# Patient Record
Sex: Male | Born: 1940 | Race: Black or African American | Hispanic: No | Marital: Married | Smoking: Former smoker
Health system: Southern US, Community
[De-identification: ages and names within clinical notes are randomized; demographics above are authoritative.]

## PROBLEM LIST (undated history)

## (undated) DIAGNOSIS — I219 Acute myocardial infarction, unspecified: Secondary | ICD-10-CM

## (undated) DIAGNOSIS — E785 Hyperlipidemia, unspecified: Secondary | ICD-10-CM

## (undated) DIAGNOSIS — R0609 Other forms of dyspnea: Secondary | ICD-10-CM

## (undated) DIAGNOSIS — E119 Type 2 diabetes mellitus without complications: Secondary | ICD-10-CM

## (undated) DIAGNOSIS — Z9289 Personal history of other medical treatment: Secondary | ICD-10-CM

## (undated) DIAGNOSIS — R972 Elevated prostate specific antigen [PSA]: Secondary | ICD-10-CM

## (undated) DIAGNOSIS — I10 Essential (primary) hypertension: Secondary | ICD-10-CM

## (undated) DIAGNOSIS — I2510 Atherosclerotic heart disease of native coronary artery without angina pectoris: Secondary | ICD-10-CM

## (undated) DIAGNOSIS — G4730 Sleep apnea, unspecified: Secondary | ICD-10-CM

## (undated) DIAGNOSIS — R0600 Dyspnea, unspecified: Secondary | ICD-10-CM

## (undated) HISTORY — DX: Atherosclerotic heart disease of native coronary artery without angina pectoris: I25.10

## (undated) HISTORY — DX: Acute myocardial infarction, unspecified: I21.9

## (undated) HISTORY — DX: Sleep apnea, unspecified: G47.30

## (undated) HISTORY — DX: Dyspnea, unspecified: R06.00

## (undated) HISTORY — DX: Other forms of dyspnea: R06.09

## (undated) HISTORY — DX: Hyperlipidemia, unspecified: E78.5

## (undated) HISTORY — DX: Type 2 diabetes mellitus without complications: E11.9

## (undated) HISTORY — DX: Elevated prostate specific antigen (PSA): R97.20

## (undated) HISTORY — DX: Personal history of other medical treatment: Z92.89

## (undated) HISTORY — DX: Essential (primary) hypertension: I10

---

## 2005-03-12 DIAGNOSIS — I219 Acute myocardial infarction, unspecified: Secondary | ICD-10-CM

## 2005-03-12 HISTORY — DX: Acute myocardial infarction, unspecified: I21.9

## 2008-09-22 DIAGNOSIS — Z9289 Personal history of other medical treatment: Secondary | ICD-10-CM

## 2008-09-22 HISTORY — DX: Personal history of other medical treatment: Z92.89

## 2009-03-17 DIAGNOSIS — K469 Unspecified abdominal hernia without obstruction or gangrene: Secondary | ICD-10-CM | POA: Insufficient documentation

## 2010-08-29 DIAGNOSIS — J310 Chronic rhinitis: Secondary | ICD-10-CM | POA: Insufficient documentation

## 2010-08-29 DIAGNOSIS — F1011 Alcohol abuse, in remission: Secondary | ICD-10-CM | POA: Insufficient documentation

## 2010-08-29 DIAGNOSIS — H9190 Unspecified hearing loss, unspecified ear: Secondary | ICD-10-CM | POA: Insufficient documentation

## 2010-08-29 DIAGNOSIS — G4730 Sleep apnea, unspecified: Secondary | ICD-10-CM | POA: Insufficient documentation

## 2013-01-30 DIAGNOSIS — I2510 Atherosclerotic heart disease of native coronary artery without angina pectoris: Secondary | ICD-10-CM | POA: Insufficient documentation

## 2013-05-05 DIAGNOSIS — E1159 Type 2 diabetes mellitus with other circulatory complications: Secondary | ICD-10-CM | POA: Insufficient documentation

## 2013-05-05 DIAGNOSIS — E1169 Type 2 diabetes mellitus with other specified complication: Secondary | ICD-10-CM | POA: Insufficient documentation

## 2013-11-18 ENCOUNTER — Encounter: Payer: Self-pay | Admitting: Cardiovascular Disease

## 2013-11-18 ENCOUNTER — Ambulatory Visit (INDEPENDENT_AMBULATORY_CARE_PROVIDER_SITE_OTHER): Payer: Medicare HMO | Admitting: Cardiovascular Disease

## 2013-11-18 VITALS — BP 120/72 | HR 74 | Ht 71.5 in | Wt 243.3 lb

## 2013-11-18 DIAGNOSIS — R0989 Other specified symptoms and signs involving the circulatory and respiratory systems: Secondary | ICD-10-CM | POA: Diagnosis not present

## 2013-11-18 DIAGNOSIS — E785 Hyperlipidemia, unspecified: Secondary | ICD-10-CM

## 2013-11-18 DIAGNOSIS — E119 Type 2 diabetes mellitus without complications: Secondary | ICD-10-CM | POA: Insufficient documentation

## 2013-11-18 DIAGNOSIS — I10 Essential (primary) hypertension: Secondary | ICD-10-CM | POA: Diagnosis not present

## 2013-11-18 DIAGNOSIS — R0609 Other forms of dyspnea: Secondary | ICD-10-CM | POA: Insufficient documentation

## 2013-11-18 DIAGNOSIS — G4733 Obstructive sleep apnea (adult) (pediatric): Secondary | ICD-10-CM | POA: Insufficient documentation

## 2013-11-18 NOTE — Assessment & Plan Note (Signed)
Controlled on current medications 

## 2013-11-18 NOTE — Assessment & Plan Note (Signed)
On statin therapy followed by his PCP 

## 2013-11-18 NOTE — Assessment & Plan Note (Signed)
The patient has new-onset dyspnea on exertion over the last to 6 months of some chest heaviness. He does have a remote history of a myocardial infarction by his account with thrombolytic therapy in [place] in 2007. He does have a factors including remote tobacco abuse, diabetes, hypertension and hyperlipidemia. I'm going to obtain a 2-D echocardiogram for LV function as well as an exercise Myoview stress test to rule out an ischemic etiology.

## 2013-11-18 NOTE — Progress Notes (Signed)
11/18/2013 Dominic Goodwin   06-22-1940  [id number]  Primary Physician Dominic Dials, MD Primary Cardiologist: Dominic Gess MD Dominic Goodwin   HPI:  Dominic Goodwin  is a 73 year old moderately overweight married African American father of one, grandfather and 4 grandchildren report provided by Dominic Goodwin for cardiovascular evaluation because of new-onset dyspnea. He is a retired Engineer, production. He moved to [place] 7 years ago to be closer to his family although since that time 2 of his sisters have passed away. His cardiac risk factor profile is notable for remote tobacco abuse having smoked 40-pack-years and quit 15 years ago. He also stopped drinking back in the 80s. 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 He apparently did have a myocardial infarction in 2007 [facility] in [place]. Over the last 6 months she's noticed increasing dyspnea on exertion with some substernal chest heaviness.   Current Outpatient Prescriptions  Medication Sig Dispense Refill  . aspirin EC 81 MG tablet Take 81 mg by mouth daily.      [name] atorvastatin (LIPITOR) 80 MG tablet Take 1 tablet by mouth daily.      . Cholecalciferol (VITAMIN D-1000 MAX ST) 1000 UNITS tablet Take 1 tablet by mouth daily.      [name] FIBER PO Take by mouth daily.      . finasteride (PROSCAR) 5 MG tablet Take 5 mg by mouth daily.      [name] glipiZIDE (GLUCOTROL) 5 MG tablet Take 5 mg by mouth 2 (two) times daily.      . isosorbide mononitrate (IMDUR) 30 MG 24 hr tablet Take 30 mg by mouth daily.      [name] losartan (COZAAR) 100 MG tablet Take 100 mg by mouth daily.      . metFORMIN (GLUCOPHAGE) 500 MG tablet Take 500 mg by mouth 2 (two) times daily.      . metoprolol (LOPRESSOR) 50 MG tablet Take 50 mg by mouth 2 (two) times daily.      . tamsulosin (FLOMAX) 0.4 MG CAPS capsule Take 0.4 mg by mouth daily.       No current facility-administered medications for this visit.    No Known Allergies  History    Social History  . Marital Status: Married    Spouse Name: N/A    Number of Children: N/A  . Years of Education: N/A   Occupational History  . Not on file.   Social History Main Topics  . Smoking status: Former Smoker    Quit date: 11/18/2000  . Smokeless tobacco: Not on file  . Alcohol Use: No  . Drug Use: No  . Sexual Activity: Not on file   Goodwin Topics Concern  . Not on file   Social History Narrative  . No narrative on file     Review of Systems: General: negative for chills, fever, night sweats or weight changes.  Cardiovascular: negative for chest pain, dyspnea on exertion, edema, orthopnea, palpitations, paroxysmal nocturnal dyspnea or shortness of breath Dermatological: negative for rash Respiratory: negative for cough or wheezing Urologic: negative for hematuria Abdominal: negative for nausea, vomiting, diarrhea, bright red blood per rectum, melena, or hematemesis Neurologic: negative for visual changes, syncope, or dizziness All Goodwin systems reviewed and are otherwise negative except as noted above.    Blood pressure 120/72, pulse 74, height 5' 11.5" (1.816 m), weight 243 lb 4.8 oz (110.36 kg).  General appearance: alert and no distress Neck: no adenopathy, no carotid bruit, no  JVD, supple, symmetrical, trachea midline and thyroid not enlarged, symmetric, no tenderness/mass/nodules Lungs: clear to auscultation bilaterally Heart: regular rate and rhythm, S1, S2 normal, no murmur, click, rub or gallop Extremities: extremities normal, atraumatic, no cyanosis or edema and 2+ pedal pulses  EKG normal sinus rhythm at 74 without ST or T wave changes  ASSESSMENT AND PLAN:   Dyspnea on exertion The patient has new-onset dyspnea on exertion over the last to 6 months of some chest heaviness. He does have a remote history of a myocardial infarction by his account with thrombolytic therapy in [place] in 2007. He does have a factors including remote tobacco  abuse, diabetes, hypertension and hyperlipidemia. I'm going to obtain a 2-D echocardiogram for LV function as well as an exercise Myoview stress test to rule out an ischemic etiology.  Essential hypertension Controlled on current medications  Hyperlipidemia On statin therapy followed by his PCP      Dominic Gess MD [facility], [facility] 11/18/2013 2:13 PM

## 2013-11-18 NOTE — Patient Instructions (Signed)
Your physician has requested that you have an echocardiogram. Echocardiography is a painless test that uses sound waves to create images of your heart. It provides your doctor with information about the size and shape of your heart and how well your heart's chambers and valves are working. This procedure takes approximately one hour. There are no restrictions for this procedure.  Your physician has requested that you have an exercise stress myoview. For further information please visit [email or web address] Please follow instruction sheet, as given.  Your physician recommends that you schedule a follow-up appointment with Dr. Allyson Sabal after your tests.

## 2013-12-03 ENCOUNTER — Ambulatory Visit (HOSPITAL_COMMUNITY)
Admission: RE | Admit: 2013-12-03 | Discharge: 2013-12-03 | Disposition: A | Discharge status: Home / Self Care | Payer: Medicare HMO | Source: Ambulatory Visit | Attending: Cardiovascular Disease | Admitting: Cardiovascular Disease

## 2013-12-03 DIAGNOSIS — E785 Hyperlipidemia, unspecified: Secondary | ICD-10-CM | POA: Insufficient documentation

## 2013-12-03 DIAGNOSIS — R0989 Other specified symptoms and signs involving the circulatory and respiratory systems: Secondary | ICD-10-CM

## 2013-12-03 DIAGNOSIS — I10 Essential (primary) hypertension: Secondary | ICD-10-CM | POA: Diagnosis not present

## 2013-12-03 DIAGNOSIS — Z87891 Personal history of nicotine dependence: Secondary | ICD-10-CM | POA: Insufficient documentation

## 2013-12-03 DIAGNOSIS — I369 Nonrheumatic tricuspid valve disorder, unspecified: Secondary | ICD-10-CM

## 2013-12-03 DIAGNOSIS — I252 Old myocardial infarction: Secondary | ICD-10-CM | POA: Diagnosis not present

## 2013-12-03 DIAGNOSIS — R0602 Shortness of breath: Secondary | ICD-10-CM | POA: Diagnosis present

## 2013-12-03 DIAGNOSIS — R0609 Other forms of dyspnea: Secondary | ICD-10-CM

## 2013-12-03 NOTE — Progress Notes (Signed)
2D Echocardiogram Complete.  12/03/2013   Dawid Dupriest, RDCS  

## 2013-12-04 ENCOUNTER — Ambulatory Visit (HOSPITAL_COMMUNITY)
Admission: RE | Admit: 2013-12-04 | Discharge: 2013-12-04 | Disposition: A | Discharge status: Home / Self Care | Payer: Medicare HMO | Source: Ambulatory Visit | Attending: Cardiovascular Disease | Admitting: Cardiovascular Disease

## 2013-12-04 ENCOUNTER — Inpatient Hospital Stay (HOSPITAL_COMMUNITY): Admission: RE | Admit: 2013-12-04 | Payer: Commercial Managed Care - HMO | Source: Ambulatory Visit

## 2013-12-04 DIAGNOSIS — R0789 Other chest pain: Secondary | ICD-10-CM | POA: Diagnosis not present

## 2013-12-04 DIAGNOSIS — E119 Type 2 diabetes mellitus without complications: Secondary | ICD-10-CM | POA: Insufficient documentation

## 2013-12-04 DIAGNOSIS — R0609 Other forms of dyspnea: Secondary | ICD-10-CM | POA: Insufficient documentation

## 2013-12-04 DIAGNOSIS — E669 Obesity, unspecified: Secondary | ICD-10-CM | POA: Diagnosis not present

## 2013-12-04 DIAGNOSIS — I10 Essential (primary) hypertension: Secondary | ICD-10-CM | POA: Insufficient documentation

## 2013-12-04 DIAGNOSIS — Z87891 Personal history of nicotine dependence: Secondary | ICD-10-CM | POA: Diagnosis not present

## 2013-12-04 DIAGNOSIS — R0989 Other specified symptoms and signs involving the circulatory and respiratory systems: Secondary | ICD-10-CM | POA: Diagnosis present

## 2013-12-04 DIAGNOSIS — E785 Hyperlipidemia, unspecified: Secondary | ICD-10-CM | POA: Diagnosis not present

## 2013-12-04 MED ORDER — TECHNETIUM TC 99M SESTAMIBI GENERIC - CARDIOLITE
31.6000 | Freq: Once | INTRAVENOUS | Status: AC | PRN
  Administered 2013-12-04: 32 via INTRAVENOUS

## 2013-12-04 MED ORDER — TECHNETIUM TC 99M SESTAMIBI GENERIC - CARDIOLITE
10.9000 | Freq: Once | INTRAVENOUS | Status: AC | PRN
  Administered 2013-12-04: 10.9 via INTRAVENOUS

## 2013-12-04 NOTE — Procedures (Addendum)
[facility] [address] [place] [address] [place] [address] [phone]  Cardiology Nuclear Med Study  Dominic Goodwin is a 73 y.o. male     MRN : [id number]     DOB: [birth year]  Procedure Date: 12/04/2013  Nuclear Med Background Indication for Stress Test:  Evaluation for Ischemia History:  CAD, MI 2007 Cardiac Risk Factors: History of Smoking, Hypertension, Lipids, NIDDM and Overweight  Symptoms:  DOE, SOB and chest heaviness   Nuclear Pre-Procedure Caffeine/Decaff Intake:  8:30pm NPO After: 6:30am   IV Site: R Antecubital  IV 0.9% NS with Angio Cath:  22g  Chest Size (in):  48 IV Started by: Koren Shiver, CNMT  Height: 6' (1.829 m)  Cup Size: n/a  BMI:  Body mass index is 32.95 kg/(m^2). Weight:  243 lb (110.224 kg)   Tech Comments:  Held metoprol and Isosorbide 24hrs prior to test    Nuclear Med Study 1 or 2 day study: 1 day  Stress Test Type:  Stress  Order Authorizing Provider:  Nanetta Batty, MD   Resting Radionuclide: Technetium 61m Sestamibi  Resting Radionuclide Dose: 10.9 mCi   Stress Radionuclide:  Technetium 15m Sestamibi  Stress Radionuclide Dose: 31.6 mCi           Stress Protocol Rest HR: 71 Stress HR: 151  Rest BP: 152/94 Stress BP: 190/84  Exercise Time (min): 7:15 METS: 7.50   Predicted Max HR: 149 bpm % Max HR: 101.34 bpm Rate Pressure Product: 16606  Dose of Adenosine (mg):  n/a Dose of Lexiscan: n/a mg  Dose of Atropine (mg): n/a Dose of Dobutamine: n/a mcg/kg/min (at max HR)  Stress Test Technologist: Ernestene Mention, CCT Nuclear Technologist: Gonzella Lex, CNMT   Rest Procedure:  Myocardial perfusion imaging was performed at rest 45 minutes following the intravenous administration of Technetium 27m Sestamibi. Stress Procedure:  The patient performed treadmill exercise using a Bruce  Protocol for 7 minutes 15 seconds. The patient stopped due to shortness of breath and fatigue. Patient denied any chest  pain.  There were significant ST-T wave changes.  Technetium 32m Sestamibi was injected IV at peak exercise and myocardial perfusion imaging was performed after a brief delay.  Transient Ischemic Dilatation (Normal <1.22):  0.88  QGS EDV:  91 ml QGS ESV:  34 ml LV Ejection Fraction: 62%     Rest ECG: NSR - Normal EKG  Stress ECG: Significant ST abnormalities consistent with ischemia. 2 mm mildly upsloping ST depression in the inferior and lateral leads  QPS Raw Data Images:  Mild diaphragmatic attenuation.  Normal left ventricular size. Stress Images:  There is mildly decreased uptake in the basal inferior wall. Rest Images:  Comparison with the stress images reveals no significant change. Subtraction (SDS):  No evidence of ischemia. LV Wall Motion:  NL LV Function; NL Wall Motion  Impression Exercise Capacity:  Good exercise capacity. BP Response:  Normal blood pressure response. Clinical Symptoms:  No significant symptoms noted. ECG Impression:  Significant ST abnormalities consistent with ischemia. Comparison with Prior Nuclear Study: No previous nuclear study performed   Overall Impression:  Low risk stress nuclear study with mild diaphragmatic attenuation artifact. Cannot exclude a small inferobasal non-transmural scar, but ischemia is not seen.Thurmon Fair, MD  12/04/2013 12:30 PM

## 2013-12-07 ENCOUNTER — Encounter: Payer: Self-pay | Admitting: *Deleted

## 2013-12-25 ENCOUNTER — Encounter: Payer: Self-pay | Admitting: *Deleted

## 2013-12-29 ENCOUNTER — Ambulatory Visit (INDEPENDENT_AMBULATORY_CARE_PROVIDER_SITE_OTHER): Payer: Medicare HMO | Admitting: Podiatry

## 2013-12-29 ENCOUNTER — Encounter: Payer: Self-pay | Admitting: Podiatry

## 2013-12-29 VITALS — BP 167/93 | HR 61 | Resp 17

## 2013-12-29 DIAGNOSIS — E119 Type 2 diabetes mellitus without complications: Secondary | ICD-10-CM

## 2013-12-29 DIAGNOSIS — M2041 Other hammer toe(s) (acquired), right foot: Secondary | ICD-10-CM

## 2013-12-29 DIAGNOSIS — M79609 Pain in unspecified limb: Secondary | ICD-10-CM

## 2013-12-29 DIAGNOSIS — B351 Tinea unguium: Secondary | ICD-10-CM

## 2013-12-29 NOTE — Progress Notes (Signed)
   Subjective:    Patient ID: Dominic Goodwin, male    DOB: 02/16/1941, 73 y.o.   MRN: [id number]  HPI Pt presents for diabetic foot care. C/o toenail pain from time to time when nails get long, currently his nails do not hurt, states that his wife does his nail care. Denies pain in feet, numbness or tingling. Current A1C 6.6 on 11/17/13   Review of Systems  Endocrine: Positive for polyuria.  All other systems reviewed and are negative.      Objective:   Physical Exam: I have reviewed his past medical history medications allergies surgeries social history and review of systems. Pulses are strongly palpable bilateral. Neurologic sensorium is intact per Semmes-Weinstein monofilament. Deep tendon reflexes are intact bilateral muscle strength is 5 over 5 dorsiflexors plantar flexors inverters everters all intrinsic musculature is intact. Orthopedic evaluation demonstrates all joints distal to the ankle a full range of motion without crepitation with exception of the first metatarsophalangeal joint of the right foot possibly associated with trauma from childhood. Radiographs were not taken today. Hammertoe deformities are present bilateral. His nails are thick yellow dystrophic onychomycotic and painful palpation. Sharp incurvated nail margin along the tibial and fibular border of the hallux bilateral. Dry xerotic skin to the legs and dorsal aspect foot.        Assessment & Plan:  Assessment: Diabetes with pain in limb secondary to onychomycosis a sharp incurvated nail margins bilateral.  Plan: Debridement of nails 1 through 5 bilateral covered service secondary to pain.

## 2014-01-01 ENCOUNTER — Ambulatory Visit: Payer: Commercial Managed Care - HMO | Admitting: Cardiovascular Disease

## 2014-02-09 ENCOUNTER — Ambulatory Visit (INDEPENDENT_AMBULATORY_CARE_PROVIDER_SITE_OTHER): Payer: Medicare HMO | Admitting: Cardiovascular Disease

## 2014-02-09 ENCOUNTER — Encounter: Payer: Self-pay | Admitting: Cardiovascular Disease

## 2014-02-09 VITALS — BP 150/82 | HR 80 | Ht 71.0 in | Wt 246.3 lb

## 2014-02-09 DIAGNOSIS — R0609 Other forms of dyspnea: Secondary | ICD-10-CM

## 2014-02-09 DIAGNOSIS — E785 Hyperlipidemia, unspecified: Secondary | ICD-10-CM

## 2014-02-09 DIAGNOSIS — I10 Essential (primary) hypertension: Secondary | ICD-10-CM

## 2014-02-09 NOTE — Assessment & Plan Note (Signed)
History of dyspnea on exertion with recent negative Myoview stress test and 2-D echocardiogram.

## 2014-02-09 NOTE — Assessment & Plan Note (Signed)
History of hypertension with blood pressure measured today at 150/82. He is on was started 100 mg a day, and Lopressor. Maintain current medications at current dose

## 2014-02-09 NOTE — Assessment & Plan Note (Signed)
History of hyperlipidemia on atorvastatin 80 mg a day followed by his PCP 

## 2014-02-09 NOTE — Patient Instructions (Signed)
Your physician wants you to follow-up in 1 year with Dr. Berry. You will receive a reminder letter in the mail 2 months in advance. If you do not receive a letter, please call our office to schedule the follow-up appointment.  

## 2014-02-09 NOTE — Progress Notes (Signed)
02/09/2014 Dominic DenmarkFred Hefty   03/21/1940  [id number]  Primary Physician Aura DialsBOUSKA,DAVID E, MD Primary Cardiologist: Runell GessJonathan J. Marvella Jenning MD Roseanne RenoFACP,FACC,FAHA, FSCAI   HPI:  Dominic Goodwin is Dominic Goodwin, Dominic Goodwin referred by Dr. Everlene OtherBouska for cardiovascular evaluation because of new-onset dyspnea. He is Dominic retired Engineer, [employer]. [place] moved [place] ago to be closer to his family although since that time 2 of his sisters have passed away. His cardiac risk factor profile is notable for remote tobacco abuse having smoked 40-pack-years and quit 15 years ago. He also stopped drinking back in the 80s. He has Dominic history of hypertension, died of hyperlipidemia. He apparently did have Dominic myocardial infarction in 2007 [place]. Over the last 6 months she's noticed increasing dyspnea on exertion with some substernal chest heaviness. Since I saw him last in September I performed Dominic 2-D echocardiogram which was normal as was Dominic Myoview stress test.   Current Outpatient Prescriptions  Medication Sig Dispense Refill  . aspirin EC 81 MG tablet Take 81 mg by mouth daily.    [name]. atorvastatin (LIPITOR) 80 MG tablet Take 1 tablet by mouth daily.    . Cholecalciferol (VITAMIN D-1000 MAX ST) 1000 UNITS tablet Take 1 tablet by mouth daily.    [name]. FIBER PO Take by mouth daily.    . finasteride (PROSCAR) 5 MG tablet Take 5 mg by mouth daily.    [name]. glipiZIDE (GLUCOTROL) 5 MG tablet Take 5 mg by mouth 2 (two) times daily.    . isosorbide mononitrate (IMDUR) 30 MG 24 hr tablet Take 30 mg by mouth daily.    [name]. losartan (COZAAR) 100 MG tablet Take 100 mg by mouth daily.    . metFORMIN (GLUCOPHAGE) 500 MG tablet Take 500 mg by mouth 2 (two) times daily.    . metoprolol (LOPRESSOR) 50 MG tablet Take 50 mg by mouth 2 (two) times daily.     No current facility-administered medications for this visit.    No Known  Allergies  History   Social History  . Marital Status: Married    Spouse Name: N/Dominic    Number of Children: N/Dominic  . Years of Education: N/Dominic   Occupational History  . Not on file.   Social History Main Topics  . Smoking status: Former Smoker    Quit date: 11/18/2000  . Smokeless tobacco: Not on file  . Alcohol Use: No  . Drug Use: No  . Sexual Activity: Not on file   Other Topics Concern  . Not on file   Social History Narrative     Review of Systems: General: negative for chills, fever, night sweats or weight changes.  Cardiovascular: negative for chest pain, dyspnea on exertion, edema, orthopnea, palpitations, paroxysmal nocturnal dyspnea or shortness of breath Dermatological: negative for rash Respiratory: negative for cough or wheezing Urologic: negative for hematuria Abdominal: negative for nausea, vomiting, diarrhea, bright red blood per rectum, melena, or hematemesis Neurologic: negative for visual changes, syncope, or dizziness All other systems reviewed and are otherwise negative except as noted above.    Blood pressure 150/82, pulse 80, height 5\' 11"  (1.803 m), weight 246 lb 4.8 oz (111.721 kg).  General appearance: alert and no distress Neck: no adenopathy, no carotid bruit, no JVD, supple, symmetrical, trachea midline and thyroid not enlarged, symmetric, no tenderness/mass/nodules Lungs: clear to auscultation bilaterally Heart: regular rate and rhythm, S1, S2  normal, no murmur, click, rub or gallop Extremities: extremities normal, atraumatic, no cyanosis or edema  EKG not performed today  ASSESSMENT AND PLAN:   Essential hypertension History of hypertension with blood pressure measured today at 150/82. He is on was started 100 mg Dominic day, and Lopressor. Maintain current medications at current dose  Hyperlipidemia History of hyperlipidemia on atorvastatin 80 mg Dominic day followed by his PCP  Dyspnea on exertion History of dyspnea on exertion with recent  negative Myoview stress test and 2-D echocardiogram.      Runell GessJonathan J. Jassmin Kemmerer MD [facility] HospitalFACP,FACC,FAHA, [facility]SC[facility] 02/09/2014 4:44 PM

## 2014-04-01 ENCOUNTER — Ambulatory Visit: Payer: Medicare HMO | Admitting: Podiatry

## 2014-11-23 DIAGNOSIS — E1159 Type 2 diabetes mellitus with other circulatory complications: Secondary | ICD-10-CM | POA: Insufficient documentation

## 2015-04-26 DIAGNOSIS — R351 Nocturia: Secondary | ICD-10-CM | POA: Insufficient documentation

## 2015-06-27 DIAGNOSIS — H269 Unspecified cataract: Secondary | ICD-10-CM | POA: Insufficient documentation

## 2015-07-25 DIAGNOSIS — G252 Other specified forms of tremor: Secondary | ICD-10-CM | POA: Insufficient documentation

## 2015-07-26 ENCOUNTER — Telehealth: Payer: Self-pay | Admitting: Cardiovascular Disease

## 2015-07-26 NOTE — Telephone Encounter (Signed)
[facility] appointment on 08/10/15 with Dr Allyson SabalBerry.  [facility] (medical records) for Dr Hazle CocaBerry's schedule on 08/10/15. lp

## 2015-08-10 ENCOUNTER — Encounter: Payer: Self-pay | Admitting: Cardiovascular Disease

## 2015-08-10 ENCOUNTER — Ambulatory Visit (INDEPENDENT_AMBULATORY_CARE_PROVIDER_SITE_OTHER): Payer: Medicare HMO | Admitting: Cardiovascular Disease

## 2015-08-10 VITALS — BP 122/73 | HR 63 | Ht 71.5 in | Wt 235.0 lb

## 2015-08-10 DIAGNOSIS — R0609 Other forms of dyspnea: Secondary | ICD-10-CM | POA: Diagnosis not present

## 2015-08-10 DIAGNOSIS — I10 Essential (primary) hypertension: Secondary | ICD-10-CM | POA: Diagnosis not present

## 2015-08-10 NOTE — Patient Instructions (Signed)

## 2015-08-10 NOTE — Assessment & Plan Note (Signed)
History of hyperlipidemia on statin therapy followed by his PCP 

## 2015-08-10 NOTE — Assessment & Plan Note (Signed)
History of dyspnea on exertion and atypical chest pain with a negative Myoview and 2-D echo performed in December 2015. His symptoms have not changed in frequency or severity.

## 2015-08-10 NOTE — Assessment & Plan Note (Signed)
History of hypertension blood pressure measured at 122/73. He is on metoprolol and losartan continue current meds at current dosing

## 2015-08-10 NOTE — Progress Notes (Signed)
08/10/2015 Dominic Goodwin   Dec 22, 1940  [id number]  Primary Physician Aura Dials, MD Primary Cardiologist: Runell Gess MD Roseanne Reno   HPI:  Mr. Faulkenberry is a 75 year old moderately overweight married African American father of one, grandfather and 4 grandchildren referred by Dr. Everlene Other for cardiovascular evaluation because of new-onset dyspnea. i last saw him in the office 02/09/14. He is a retired Engineer, production. He moved to [place] 7 years ago to be closer to his family although since that time 2 of his sisters have passed away. His cardiac risk factor profile is notable for remote tobacco abuse having smoked 40-pack-years and quit 15 years ago. He also stopped drinking back in the 80s. 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 He apparently did have a myocardial infarction in 2007 treated with thrombolytics in [place]. He had a negative 2-D echo and Myoview stress test September 2015. Since I saw him in December 2015, he's had occasional substernal chest pain  Current Outpatient Prescriptions  Medication Sig Dispense Refill  . aspirin EC 81 MG tablet Take 81 mg by mouth daily.    [name] atorvastatin (LIPITOR) 80 MG tablet Take 1 tablet by mouth daily.    [name] buPROPion (WELLBUTRIN XL) 300 MG 24 hr tablet Take 1 tablet by mouth daily.    . busPIRone (BUSPAR) 15 MG tablet Take 1 tablet by mouth 2 (two) times daily.    . cetirizine (ZYRTEC) 10 MG tablet Take 10 mg by mouth daily.    . Cholecalciferol (VITAMIN D-1000 MAX ST) 1000 UNITS tablet Take 1 tablet by mouth daily.    . cycloSPORINE (RESTASIS) 0.05 % ophthalmic emulsion Place 1 drop into both eyes daily.    [name] FIBER PO Take by mouth daily.    . finasteride (PROSCAR) 5 MG tablet Take 5 mg by mouth daily.    . fluticasone (FLONASE) 50 MCG/ACT nasal spray Place 2 sprays into both nostrils as needed.    [name] glipiZIDE (GLUCOTROL) 5 MG tablet Take 5 mg by mouth 2 (two) times daily.    [name] ipratropium  (ATROVENT) 0.03 % nasal spray Place 2 sprays into both nostrils daily.    . isosorbide mononitrate (IMDUR) 30 MG 24 hr tablet Take 30 mg by mouth daily.    [name] losartan (COZAAR) 100 MG tablet Take 100 mg by mouth daily.    . metFORMIN (GLUCOPHAGE) 500 MG tablet Take 500 mg by mouth 2 (two) times daily.    . metoprolol (LOPRESSOR) 50 MG tablet Take 50 mg by mouth 2 (two) times daily.    . primidone (MYSOLINE) 50 MG tablet Take 1 tablet by mouth 2 (two) times daily.    . tamsulosin (FLOMAX) 0.4 MG CAPS capsule Take 1 capsule by mouth daily.     No current facility-administered medications for this visit.    No Known Allergies  Social History   Social History  . Marital Status: Married    Spouse Name: N/A  . Number of Children: N/A  . Years of Education: N/A   Occupational History  . Not on file.   Social History Main Topics  . Smoking status: Former Smoker    Quit date: 11/18/2000  . Smokeless tobacco: Not on file  . Alcohol Use: No  . Drug Use: No  . Sexual Activity: Not on file   Other Topics Concern  . Not on file   Social History Narrative     Review of Systems: General: negative for  chills, fever, night sweats or weight changes.  Cardiovascular: negative for chest pain, dyspnea on exertion, edema, orthopnea, palpitations, paroxysmal nocturnal dyspnea or shortness of breath Dermatological: negative for rash Respiratory: negative for cough or wheezing Urologic: negative for hematuria Abdominal: negative for nausea, vomiting, diarrhea, bright red blood per rectum, melena, or hematemesis Neurologic: negative for visual changes, syncope, or dizziness All other systems reviewed and are otherwise negative except as noted above.    Blood pressure 122/73, pulse 63, height 5' 11.5" (1.816 m), weight 235 lb (106.595 kg).  General appearance: alert and no distress Neck: no adenopathy, no carotid bruit, no JVD, supple, symmetrical, trachea midline and thyroid not enlarged,  symmetric, no tenderness/mass/nodules Lungs: clear to auscultation bilaterally Heart: regular rate and rhythm, S1, S2 normal, no murmur, click, rub or gallop Extremities: extremities normal, atraumatic, no cyanosis or edema  EKG normal sinus rhythm at 63 without ST or T-wave changes. I personally reviewed this EKG  ASSESSMENT AND PLAN:   Essential hypertension History of hypertension blood pressure measured at 122/73. He is on metoprolol and losartan continue current meds at current dosing  Hyperlipidemia History of hyperlipidemia on statin therapy followed by his PCP  Dyspnea on exertion History of dyspnea on exertion and atypical chest pain with a negative Myoview and 2-D echo performed in December 2015. His symptoms have not changed in frequency or severity.      Runell GessJonathan J. Hedy Garro MD FACP,FACC,FAHA, [facility] 08/10/2015 10:59 AM

## 2015-10-20 DIAGNOSIS — H04129 Dry eye syndrome of unspecified lacrimal gland: Secondary | ICD-10-CM | POA: Insufficient documentation

## 2016-02-01 DIAGNOSIS — Z0000 Encounter for general adult medical examination without abnormal findings: Secondary | ICD-10-CM | POA: Insufficient documentation

## 2017-01-23 DIAGNOSIS — Z1211 Encounter for screening for malignant neoplasm of colon: Secondary | ICD-10-CM | POA: Insufficient documentation

## 2017-01-23 DIAGNOSIS — R413 Other amnesia: Secondary | ICD-10-CM | POA: Insufficient documentation

## 2017-07-24 ENCOUNTER — Telehealth: Payer: Self-pay | Admitting: Cardiovascular Disease

## 2017-07-24 NOTE — Telephone Encounter (Signed)
Received records from [facility] on 07/24/17, Appt 08/13/17 @ 3:00PM. NV

## 2017-08-13 ENCOUNTER — Ambulatory Visit: Payer: Medicare HMO | Admitting: Cardiovascular Disease

## 2017-08-13 ENCOUNTER — Encounter: Payer: Self-pay | Admitting: Cardiovascular Disease

## 2017-08-13 DIAGNOSIS — I10 Essential (primary) hypertension: Secondary | ICD-10-CM | POA: Diagnosis not present

## 2017-08-13 NOTE — Assessment & Plan Note (Signed)
History of hyperlipidemia on statin therapy. 

## 2017-08-13 NOTE — Assessment & Plan Note (Signed)
History of essential hypertension with blood pressure measured at 121/71.  He is on losartan.  Continue current meds at current dosing.

## 2017-08-13 NOTE — Progress Notes (Signed)
08/13/2017 Dominic Goodwin   February 13, 1941  [id number]  Primary Physician Dominic Harries, MD Primary Cardiologist: Dominic Gess MD Dominic Goodwin, [place]  HPI:  Dominic Goodwin is a 77 y.o.  moderately overweight married African American father of one, grandfather and 4 grandchildren referred by Dr. Everlene Other for cardiovascular evaluation because of new-onset dyspnea. i last saw him in the office 08/10/2015.Dominic Goodwin He is a retired Engineer, production. He moved to [place] 7 years ago to be closer to his family although since that time 2 of his sisters have passed away. His cardiac risk factor profile is notable for remote tobacco abuse having smoked 40-pack-years and quit 15 years ago. He also stopped drinking back in the 80s. 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 He apparently did have a myocardial infarction in 2007 treated with thrombolytics in [place]. He had a negative 2-D echo and Myoview stress test September 2015. Since I saw him in December 2015, he's had occasional substernal chest pain  He apparently had an episode of chest pain which is fairly self-limited well mowing his lawn recently.  He became diaphoretic however the symptoms passed fairly quickly.  He has not had any symptoms since.   Current Meds  Medication Sig  . aspirin EC 81 MG tablet Take 81 mg by mouth daily.  Dominic Goodwin atorvastatin (LIPITOR) 80 MG tablet Take 1 tablet by mouth daily.  . cetirizine (ZYRTEC) 10 MG tablet Take 10 mg by mouth daily.  . Cholecalciferol (VITAMIN D-1000 MAX ST) 1000 UNITS tablet Take 1 tablet by mouth daily.  . finasteride (PROSCAR) 5 MG tablet Take 5 mg by mouth daily.  Dominic Goodwin glipiZIDE (GLUCOTROL) 5 MG tablet Take 5 mg by mouth 2 (two) times daily.  . isosorbide mononitrate (IMDUR) 30 MG 24 hr tablet Take 30 mg by mouth daily.  Dominic Goodwin losartan (COZAAR) 100 MG tablet Take 100 mg by mouth daily.  . tamsulosin (FLOMAX) 0.4 MG CAPS capsule Take 1 capsule by mouth daily.  . [DISCONTINUED] buPROPion  (WELLBUTRIN XL) 300 MG 24 hr tablet Take 1 tablet by mouth daily.  . [DISCONTINUED] busPIRone (BUSPAR) 15 MG tablet Take 1 tablet by mouth 2 (two) times daily.  . [DISCONTINUED] cycloSPORINE (RESTASIS) 0.05 % ophthalmic emulsion Place 1 drop into both eyes daily.  . [DISCONTINUED] FIBER PO Take by mouth daily.  . [DISCONTINUED] ipratropium (ATROVENT) 0.03 % nasal spray Place 2 sprays into both nostrils daily.  . [DISCONTINUED] metFORMIN (GLUCOPHAGE) 500 MG tablet Take 500 mg by mouth 2 (two) times daily.  . [DISCONTINUED] metoprolol (LOPRESSOR) 50 MG tablet Take 50 mg by mouth 2 (two) times daily.     No Known Allergies  Social History   Socioeconomic History  . Marital status: Married    Spouse name: Not on file  . Number of children: Not on file  . Years of education: Not on file  . Highest education level: Not on file  Occupational History  . Not on file  Social Needs  . Financial resource strain: Not on file  . Food insecurity:    Worry: Not on file    Inability: Not on file  . Transportation needs:    Medical: Not on file    Non-medical: Not on file  Tobacco Use  . Smoking status: Former Smoker    Last attempt to quit: 11/18/2000    Years since quitting: 16.7  . Smokeless tobacco: Never Used  Substance and Sexual Activity  . Alcohol use:  No  . Drug use: No  . Sexual activity: Not on file  Lifestyle  . Physical activity:    Days per week: Not on file    Minutes per session: Not on file  . Stress: Not on file  Relationships  . Social connections:    Talks on phone: Not on file    Gets together: Not on file    Attends religious service: Not on file    Active member of club or organization: Not on file    Attends meetings of clubs or organizations: Not on file    Relationship status: Not on file  . Intimate partner violence:    Fear of current or ex partner: Not on file    Emotionally abused: Not on file    Physically abused: Not on file    Forced sexual  activity: Not on file  Other Topics Concern  . Not on file  Social History Narrative  . Not on file     Review of Systems: General: negative for chills, fever, night sweats or weight changes.  Cardiovascular: negative for chest pain, dyspnea on exertion, edema, orthopnea, palpitations, paroxysmal nocturnal dyspnea or shortness of breath Dermatological: negative for rash Respiratory: negative for cough or wheezing Urologic: negative for hematuria Abdominal: negative for nausea, vomiting, diarrhea, bright red blood per rectum, melena, or hematemesis Neurologic: negative for visual changes, syncope, or dizziness All other systems reviewed and are otherwise negative except as noted above.    Blood pressure 121/71, pulse 71, height 5\' 11"  (1.803 m), weight 240 lb (108.9 kg).  General appearance: alert and no distress Neck: no adenopathy, no carotid bruit, no JVD, supple, symmetrical, trachea midline and thyroid not enlarged, symmetric, no tenderness/mass/nodules Lungs: clear to auscultation bilaterally Heart: regular rate and rhythm, S1, S2 normal, no murmur, click, rub or gallop Extremities: extremities normal, atraumatic, no cyanosis or edema Pulses: 2+ and symmetric Skin: Skin color, texture, turgor normal. No rashes or lesions Neurologic: Alert and oriented X 3, normal strength and tone. Normal symmetric reflexes. Normal coordination and gait  EKG sinus rhythm at 71 without ST or T wave changes.  I personally reviewed this EKG  ASSESSMENT AND PLAN:   Essential hypertension History of essential hypertension with blood pressure measured at 121/71.  He is on losartan.  Continue current meds at current dosing.  Hyperlipidemia History of hyperlipidemia on statin therapy.      Dominic GessJonathan J. Latonja Bobeck MD FACP,FACC,FAHA, [facility] 08/13/2017 3:32 PM

## 2017-08-13 NOTE — Patient Instructions (Signed)

## 2017-11-09 ENCOUNTER — Other Ambulatory Visit: Payer: Self-pay | Admitting: Family Medicine

## 2017-11-09 DIAGNOSIS — Z136 Encounter for screening for cardiovascular disorders: Secondary | ICD-10-CM

## 2017-12-10 ENCOUNTER — Ambulatory Visit
Admission: RE | Admit: 2017-12-10 | Discharge: 2017-12-10 | Disposition: A | Discharge status: Home / Self Care | Payer: Medicare HMO | Source: Ambulatory Visit | Attending: Family Medicine | Admitting: Family Medicine

## 2017-12-10 ENCOUNTER — Ambulatory Visit: Payer: Medicare HMO

## 2017-12-10 DIAGNOSIS — Z136 Encounter for screening for cardiovascular disorders: Secondary | ICD-10-CM

## 2018-02-22 DIAGNOSIS — E1165 Type 2 diabetes mellitus with hyperglycemia: Secondary | ICD-10-CM | POA: Insufficient documentation

## 2018-05-14 ENCOUNTER — Ambulatory Visit: Payer: Medicare HMO | Admitting: Cardiovascular Disease

## 2018-05-14 ENCOUNTER — Encounter: Payer: Self-pay | Admitting: Cardiovascular Disease

## 2018-05-14 DIAGNOSIS — R0609 Other forms of dyspnea: Secondary | ICD-10-CM | POA: Diagnosis not present

## 2018-05-14 DIAGNOSIS — E782 Mixed hyperlipidemia: Secondary | ICD-10-CM | POA: Diagnosis not present

## 2018-05-14 DIAGNOSIS — I10 Essential (primary) hypertension: Secondary | ICD-10-CM

## 2018-05-14 DIAGNOSIS — R0789 Other chest pain: Secondary | ICD-10-CM

## 2018-05-14 MED ORDER — METOPROLOL TARTRATE 50 MG PO TABS: 50.0000 mg | ORAL_TABLET | Freq: Once | ORAL | 0 refills | Status: DC

## 2018-05-14 NOTE — Assessment & Plan Note (Signed)
History of essential hypertension her blood pressure measured today 138/72.  He is on losartan.

## 2018-05-14 NOTE — Assessment & Plan Note (Signed)
Dominic Goodwin  has described over half a dozen episodes of atypical chest pain since I saw him 9 months ago.  He did have a negative Myoview 5 years ago.  I am going to get a coronary CTA to further evaluate.

## 2018-05-14 NOTE — Assessment & Plan Note (Signed)
History of hyperlipidemia on statin therapy followed by his PCP 

## 2018-05-14 NOTE — Progress Notes (Signed)
05/14/2018 Dominic Goodwin   1940/10/28  [id number]  Primary Physician Dominic Harries, MD Primary Cardiologist: Dominic Gess MD Dominic Goodwin, [place]  HPI:  Dominic Goodwin is a 78 y.o.   moderately overweight married African American father of one, grandfather and 4 grandchildren referred by Dr. Everlene Other for cardiovascular evaluation because of new-onset dyspnea. i last saw him in the office  08/13/2017.[name] He is a retired Engineer, production. He moved to [place] 7 years ago to be closer to his family although since that time 2 of his sisters have passed away. His cardiac risk factor profile is notable for remote tobacco abuse having smoked 40-pack-years and quit 15 years ago. He also stopped drinking back in the 80s. 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 He apparently did have a myocardial infarction in 2007 treated with thrombolytics in [place]. He had a negative 2-D echo and Myoview stress test September 2015. Since I saw him in December 2015, he's had occasional substernal chest pain  He apparently had an episode of chest pain which is fairly self-limited well mowing his lawn recently.  He became diaphoretic however the symptoms passed fairly quickly.  He has not had any symptoms since.  He is under a lot of stress with his wife recently diagnosed with breast cancer and being in a motor vehicle accident.  He has described multiple episodes of chest pain since I last saw him 9 months ago lasting for minutes at a time.  I am certainly concerned given his risk factor profile.   Current Meds  Medication Sig  . aspirin EC 81 MG tablet Take 81 mg by mouth daily.  [name] atorvastatin (LIPITOR) 80 MG tablet Take 1 tablet by mouth daily.  . cetirizine (ZYRTEC) 10 MG tablet Take 10 mg by mouth daily.  . Cholecalciferol (VITAMIN D-1000 MAX ST) 1000 UNITS tablet Take 1 tablet by mouth daily.  . finasteride (PROSCAR) 5 MG tablet Take 5 mg by mouth daily.  [name] glipiZIDE (GLUCOTROL) 5 MG  tablet Take 5 mg by mouth 2 (two) times daily.  . isosorbide mononitrate (IMDUR) 30 MG 24 hr tablet Take 30 mg by mouth daily.  [name] losartan (COZAAR) 100 MG tablet Take 100 mg by mouth daily.  . metFORMIN (GLUCOPHAGE) 1000 MG tablet   . tamsulosin (FLOMAX) 0.4 MG CAPS capsule Take 1 capsule by mouth daily.     No Known Allergies  Social History   Socioeconomic History  . Marital status: Married    Spouse name: Not on file  . Number of children: Not on file  . Years of education: Not on file  . Highest education level: Not on file  Occupational History  . Not on file  Social Needs  . Financial resource strain: Not on file  . Food insecurity:    Worry: Not on file    Inability: Not on file  . Transportation needs:    Medical: Not on file    Non-medical: Not on file  Tobacco Use  . Smoking status: Former Smoker    Last attempt to quit: 11/18/2000    Years since quitting: 17.4  . Smokeless tobacco: Never Used  Substance and Sexual Activity  . Alcohol use: No  . Drug use: No  . Sexual activity: Not on file  Lifestyle  . Physical activity:    Days per week: Not on file    Minutes per session: Not on file  . Stress: Not on file  Relationships  . Social connections:    Talks on phone: Not on file    Gets together: Not on file    Attends religious service: Not on file    Active member of club or organization: Not on file    Attends meetings of clubs or organizations: Not on file    Relationship status: Not on file  . Intimate partner violence:    Fear of current or ex partner: Not on file    Emotionally abused: Not on file    Physically abused: Not on file    Forced sexual activity: Not on file  Other Topics Concern  . Not on file  Social History Narrative  . Not on file     Review of Systems: General: negative for chills, fever, night sweats or weight changes.  Cardiovascular: negative for chest pain, dyspnea on exertion, edema, orthopnea, palpitations, paroxysmal  nocturnal dyspnea or shortness of breath Dermatological: negative for rash Respiratory: negative for cough or wheezing Urologic: negative for hematuria Abdominal: negative for nausea, vomiting, diarrhea, bright red blood per rectum, melena, or hematemesis Neurologic: negative for visual changes, syncope, or dizziness All other systems reviewed and are otherwise negative except as noted above.    Blood pressure 138/72, pulse 71, height 5\' 11"  (1.803 m), weight 242 lb 12.8 oz (110.1 kg).  General appearance: alert and no distress Neck: no adenopathy, no carotid bruit, no JVD, supple, symmetrical, trachea midline and thyroid not enlarged, symmetric, no tenderness/mass/nodules Lungs: clear to auscultation bilaterally Heart: regular rate and rhythm, S1, S2 normal, no murmur, click, rub or gallop Extremities: extremities normal, atraumatic, no cyanosis or edema Pulses: 2+ and symmetric Skin: Skin color, texture, turgor normal. No rashes or lesions Neurologic: Alert and oriented X 3, normal strength and tone. Normal symmetric reflexes. Normal coordination and gait  EKG sinus rhythm at 71 without ST or T wave changes.  I personally reviewed this EKG.  ASSESSMENT AND PLAN:   Dyspnea on exertion History of dyspnea on exertion probably related to COPD.  His last 2D echo performed 12/03/2013 revealed normal LV systolic function with grade 1 diastolic dysfunction.  Essential hypertension History of essential hypertension her blood pressure measured today 138/72.  He is on losartan.  Hyperlipidemia History of hyperlipidemia on statin therapy followed by his PCP  Atypical chest pain Mr. Dominic Goodwin  has described over half a dozen episodes of atypical chest pain since I saw him 9 months ago.  He did have a negative Myoview 5 years ago.  I am going to get a coronary CTA to further evaluate.      Dominic Gess MD FACP,FACC,FAHA, [facility] 05/14/2018 3:18 PM

## 2018-05-14 NOTE — Assessment & Plan Note (Signed)
History of dyspnea on exertion probably related to COPD.  His last 2D echo performed 12/03/2013 revealed normal LV systolic function with grade 1 diastolic dysfunction.

## 2018-05-14 NOTE — Patient Instructions (Signed)
Please arrive at the [facility] main entrance of [facility] at xx:xx AM (30-45 minutes prior to test start time)  [facility] [address] [place], [place] [address] [phone]  Proceed to the [facility] Radiology Department (First Floor).  Please follow these instructions carefully (unless otherwise directed):  Hold all erectile dysfunction medications at least 48 hours prior to test.  On the Night Before the Test: . Be sure to Drink plenty of water. . Do not consume any caffeinated/decaffeinated beverages or chocolate 12 hours prior to your test. . Do not take any antihistamines 12 hours prior to your test. . If you take Metformin/Glipizide do not take 24 hours prior to test. . If the patient has contrast allergy: ? Patient will need a prescription for Prednisone and very clear instructions (as follows): 1. Prednisone 50 mg - take 13 hours prior to test 2. Take another Prednisone 50 mg 7 hours prior to test 3. Take another Prednisone 50 mg 1 hour prior to test 4. Take Benadryl 50 mg 1 hour prior to test . Patient must complete all four doses of above prophylactic medications. . Patient will need a ride after test due to Benadryl.  On the Day of the Test: . Drink plenty of water. Do not drink any water within one hour of the test. . Do not eat any food 4 hours prior to the test. . You may take your regular medications prior to the test.  . Take metoprolol 50 mg (Lopressor) two hours prior to test.       After the Test: . Drink plenty of water. . After receiving IV contrast, you may experience a mild flushed feeling. This is normal. . On occasion, you may experience a mild rash up to 24 hours after the test. This is not dangerous. If this occurs, you can take Benadryl 25 mg and increase your fluid intake. . If you experience trouble breathing, this can be serious. If it is severe call 911 IMMEDIATELY. If it is mild, please call our office. . If you  take any of these medications: Glipizide/Metformin, Avandament, Glucavance, please do not take 48 hours after completing test.  Lab work: Your physician recommends that you return for lab work within 1-2 weeks of your CORONARY CTA (CBC and BMP)  If you have labs (blood work) drawn today and your tests are completely normal, you will receive your results only by: [name] MyChart Message (if you have MyChart) OR . A paper copy in the mail If you have any lab test that is abnormal or we need to change your treatment, we will call you to review the results.  Follow-Up: At [facility], you and your health needs are our priority.  As part of our continuing mission to provide you with exceptional heart care, we have created designated Provider Care Teams.  These Care Teams include your primary Cardiologist (physician) and Advanced Practice Providers (APPs -  Physician Assistants and Nurse Practitioners) who all work together to provide you with the care you need, when you need it. . You will need a follow up appointment in 6 months with an APP and in 12 months with Dr. Allyson Sabal.  Please call our office 2 months in advance to schedule this appointment.  You may see one of the following Advanced Practice Providers on your designated Care Team:   . Corine Shelter, [place] . Azalee Course, PA-C . Micah Flesher, PA-C . Joni Reining, DNP . Theodore Demark,  PA-C . Judy Pimple, PA-C . Marjie Skiff, PA-C

## 2018-08-07 ENCOUNTER — Ambulatory Visit (HOSPITAL_COMMUNITY): Payer: Medicare HMO

## 2018-08-19 ENCOUNTER — Other Ambulatory Visit: Payer: Self-pay

## 2018-08-19 DIAGNOSIS — R079 Chest pain, unspecified: Secondary | ICD-10-CM

## 2018-08-19 NOTE — Progress Notes (Signed)
Ordered lexi for pt and staff message forwarded to Melynda Ripple, Pearletha Forge, MD  Annita Brod, RN        Lexi   Previous Messages    ----- Message -----  From: Annita Brod, RN  Sent: 08/14/2018  2:41 PM EDT  To: Lorretta Harp, MD  Subject: CORONARY CTA                   5/28 CARDIAC CT cancelled d/t cost. Alternate testing?   ----- Message -----  From: Armando Gang  Sent: 08/06/2018  2:53 PM EDT  To: Annita Brod, RN  Subject: CT CORONARY MORPH                 Financial (per pt cant afford) Pt cancel the test

## 2018-08-20 ENCOUNTER — Encounter: Payer: Self-pay | Admitting: Cardiovascular Disease

## 2018-08-22 ENCOUNTER — Telehealth (HOSPITAL_COMMUNITY): Payer: Self-pay | Admitting: *Deleted

## 2018-08-22 NOTE — Telephone Encounter (Signed)
Close encounter 

## 2018-08-26 ENCOUNTER — Other Ambulatory Visit: Payer: Self-pay

## 2018-08-26 ENCOUNTER — Ambulatory Visit (HOSPITAL_COMMUNITY)
Admission: RE | Admit: 2018-08-26 | Discharge: 2018-08-26 | Disposition: A | Discharge status: Home / Self Care | Payer: Medicare HMO | Source: Ambulatory Visit | Attending: Cardiovascular Disease | Admitting: Cardiovascular Disease

## 2018-08-26 DIAGNOSIS — R079 Chest pain, unspecified: Secondary | ICD-10-CM

## 2018-08-26 LAB — MYOCARDIAL PERFUSION IMAGING
LV dias vol: 119 mL (ref 62–150)
LV sys vol: 54 mL
Peak HR: 83 {beats}/min
Rest HR: 56 {beats}/min
SDS: 2
SRS: 1
SSS: 3
TID: 1.12

## 2018-08-26 MED ORDER — REGADENOSON 0.4 MG/5ML IV SOLN
0.4000 mg | Freq: Once | INTRAVENOUS | Status: AC
  Administered 2018-08-26: 0.4 mg via INTRAVENOUS

## 2018-08-26 MED ORDER — TECHNETIUM TC 99M TETROFOSMIN IV KIT
32.0000 | PACK | Freq: Once | INTRAVENOUS | Status: AC | PRN
  Administered 2018-08-26: 32 via INTRAVENOUS
  Filled 2018-08-26: qty 32

## 2018-08-26 MED ORDER — TECHNETIUM TC 99M TETROFOSMIN IV KIT
10.6000 | PACK | Freq: Once | INTRAVENOUS | Status: AC | PRN
  Administered 2018-08-26: 10.6 via INTRAVENOUS
  Filled 2018-08-26: qty 11

## 2018-08-28 ENCOUNTER — Telehealth: Payer: Self-pay | Admitting: Cardiovascular Disease

## 2018-08-28 NOTE — Telephone Encounter (Signed)
LM on name-verified VM with results  Notes recorded by Lorretta Harp, MD on 08/26/2018 at 1:23 PM EDT  Essentially normal study. Repeat when clinically indicated.

## 2018-08-28 NOTE — Telephone Encounter (Signed)
Patient is calling for stress test

## 2018-12-16 ENCOUNTER — Telehealth: Payer: Self-pay | Admitting: *Deleted

## 2018-12-16 NOTE — Telephone Encounter (Signed)
Unable to reach Mr.Mlacknall by phone.

## 2018-12-23 ENCOUNTER — Telehealth: Payer: Self-pay | Admitting: *Deleted

## 2018-12-23 NOTE — Telephone Encounter (Signed)
Dominic Goodwin's phone is not in service.

## 2019-06-08 ENCOUNTER — Telehealth: Payer: Self-pay | Admitting: Cardiovascular Disease

## 2019-06-08 NOTE — Telephone Encounter (Signed)
Not a working number-- AF

## 2019-06-10 ENCOUNTER — Telehealth: Payer: Self-pay | Admitting: Cardiovascular Disease

## 2019-07-10 ENCOUNTER — Encounter: Payer: Self-pay | Admitting: Cardiovascular Disease

## 2020-02-24 NOTE — Telephone Encounter (Signed)
open encounter in error 

## 2020-03-10 ENCOUNTER — Telehealth (HOSPITAL_COMMUNITY): Payer: Self-pay | Admitting: *Deleted

## 2020-03-14 ENCOUNTER — Emergency Department (HOSPITAL_COMMUNITY): Payer: No Typology Code available for payment source

## 2020-03-14 ENCOUNTER — Observation Stay (HOSPITAL_COMMUNITY)
Admission: EM | Admit: 2020-03-14 | Discharge: 2020-03-15 | Disposition: A | Discharge status: Home / Self Care | Payer: No Typology Code available for payment source | Attending: Internal Medicine | Admitting: Internal Medicine

## 2020-03-14 ENCOUNTER — Other Ambulatory Visit: Payer: Self-pay

## 2020-03-14 ENCOUNTER — Encounter (HOSPITAL_COMMUNITY): Payer: Self-pay

## 2020-03-14 DIAGNOSIS — Z7982 Long term (current) use of aspirin: Secondary | ICD-10-CM | POA: Insufficient documentation

## 2020-03-14 DIAGNOSIS — E119 Type 2 diabetes mellitus without complications: Secondary | ICD-10-CM | POA: Diagnosis not present

## 2020-03-14 DIAGNOSIS — Z79899 Other long term (current) drug therapy: Secondary | ICD-10-CM | POA: Insufficient documentation

## 2020-03-14 DIAGNOSIS — G4733 Obstructive sleep apnea (adult) (pediatric): Secondary | ICD-10-CM | POA: Diagnosis present

## 2020-03-14 DIAGNOSIS — Z87891 Personal history of nicotine dependence: Secondary | ICD-10-CM | POA: Diagnosis not present

## 2020-03-14 DIAGNOSIS — Z7984 Long term (current) use of oral hypoglycemic drugs: Secondary | ICD-10-CM | POA: Diagnosis not present

## 2020-03-14 DIAGNOSIS — R531 Weakness: Secondary | ICD-10-CM

## 2020-03-14 DIAGNOSIS — I2510 Atherosclerotic heart disease of native coronary artery without angina pectoris: Secondary | ICD-10-CM | POA: Diagnosis present

## 2020-03-14 DIAGNOSIS — E785 Hyperlipidemia, unspecified: Secondary | ICD-10-CM | POA: Diagnosis present

## 2020-03-14 DIAGNOSIS — I10 Essential (primary) hypertension: Secondary | ICD-10-CM | POA: Diagnosis present

## 2020-03-14 DIAGNOSIS — R630 Anorexia: Secondary | ICD-10-CM | POA: Diagnosis present

## 2020-03-14 DIAGNOSIS — R7989 Other specified abnormal findings of blood chemistry: Secondary | ICD-10-CM | POA: Diagnosis present

## 2020-03-14 DIAGNOSIS — E782 Mixed hyperlipidemia: Secondary | ICD-10-CM | POA: Diagnosis not present

## 2020-03-14 DIAGNOSIS — U071 COVID-19: Secondary | ICD-10-CM | POA: Diagnosis not present

## 2020-03-14 LAB — COMPREHENSIVE METABOLIC PANEL
ALT: 107 U/L — ABNORMAL HIGH (ref 0–44)
AST: 100 U/L — ABNORMAL HIGH (ref 15–41)
Albumin: 3.2 g/dL — ABNORMAL LOW (ref 3.5–5.0)
Alkaline Phosphatase: 48 U/L (ref 38–126)
Anion gap: 10 (ref 5–15)
BUN: 22 mg/dL (ref 8–23)
CO2: 25 mmol/L (ref 22–32)
Calcium: 9 mg/dL (ref 8.9–10.3)
Chloride: 102 mmol/L (ref 98–111)
Creatinine, Ser: 1.32 mg/dL — ABNORMAL HIGH (ref 0.61–1.24)
GFR, Estimated: 56 mL/min — ABNORMAL LOW (ref >60)
Glucose, Bld: 138 mg/dL — ABNORMAL HIGH (ref 70–99)
Potassium: 3.9 mmol/L (ref 3.5–5.1)
Sodium: 137 mmol/L (ref 135–145)
Total Bilirubin: 0.7 mg/dL (ref 0.3–1.2)
Total Protein: 7.2 g/dL (ref 6.5–8.1)

## 2020-03-14 LAB — FERRITIN: Ferritin: 365 ng/mL — ABNORMAL HIGH (ref 24–336)

## 2020-03-14 LAB — CBC
HCT: 38.6 % — ABNORMAL LOW (ref 39.0–52.0)
Hemoglobin: 12.9 g/dL — ABNORMAL LOW (ref 13.0–17.0)
MCH: 30.8 pg (ref 26.0–34.0)
MCHC: 33.4 g/dL (ref 30.0–36.0)
MCV: 92.1 fL (ref 80.0–100.0)
Platelets: 234 10*3/uL (ref 150–400)
RBC: 4.19 MIL/uL — ABNORMAL LOW (ref 4.22–5.81)
RDW: 12.8 % (ref 11.5–15.5)
WBC: 6 10*3/uL (ref 4.0–10.5)
nRBC: 0 % (ref 0.0–0.2)

## 2020-03-14 LAB — PROCALCITONIN: Procalcitonin: 0.15 ng/mL

## 2020-03-14 LAB — LACTIC ACID, PLASMA: Lactic Acid, Venous: 1.4 mmol/L (ref 0.5–1.9)

## 2020-03-14 LAB — D-DIMER, QUANTITATIVE: D-Dimer, Quant: 1.93 ug/mL-FEU — ABNORMAL HIGH (ref 0.00–0.50)

## 2020-03-14 LAB — FIBRINOGEN: Fibrinogen: 779 mg/dL — ABNORMAL HIGH (ref 210–475)

## 2020-03-14 LAB — POC SARS CORONAVIRUS 2 AG -  ED: SARS Coronavirus 2 Ag: POSITIVE — AB

## 2020-03-14 LAB — TRIGLYCERIDES: Triglycerides: 168 mg/dL — ABNORMAL HIGH (ref <150)

## 2020-03-14 LAB — LACTATE DEHYDROGENASE: LDH: 367 U/L — ABNORMAL HIGH (ref 98–192)

## 2020-03-14 LAB — C-REACTIVE PROTEIN: CRP: 13.4 mg/dL — ABNORMAL HIGH (ref <1.0)

## 2020-03-14 LAB — CBG MONITORING, ED
Glucose-Capillary: 114 mg/dL — ABNORMAL HIGH (ref 70–99)
Glucose-Capillary: 109 mg/dL — ABNORMAL HIGH (ref 70–99)

## 2020-03-14 MED ORDER — INSULIN ASPART 100 UNIT/ML ~~LOC~~ SOLN
0.0000 [IU] | Freq: Every day | SUBCUTANEOUS | Status: DC
  Filled 2020-03-14: qty 0.05

## 2020-03-14 MED ORDER — INSULIN ASPART 100 UNIT/ML ~~LOC~~ SOLN
0.0000 [IU] | Freq: Three times a day (TID) | SUBCUTANEOUS | Status: DC
  Administered 2020-03-15: 1 [IU] via SUBCUTANEOUS
  Filled 2020-03-14: qty 0.09

## 2020-03-14 MED ORDER — SODIUM CHLORIDE 0.9 % IV SOLN
100.0000 mg | Freq: Every day | INTRAVENOUS | Status: DC
  Administered 2020-03-15: 100 mg via INTRAVENOUS
  Filled 2020-03-14: qty 20

## 2020-03-14 MED ORDER — SODIUM CHLORIDE 0.9% FLUSH
3.0000 mL | Freq: Two times a day (BID) | INTRAVENOUS | Status: DC
  Administered 2020-03-14: 3 mL via INTRAVENOUS

## 2020-03-14 MED ORDER — ASCORBIC ACID 500 MG PO TABS
500.0000 mg | ORAL_TABLET | Freq: Every day | ORAL | Status: DC
  Administered 2020-03-15: 500 mg via ORAL
  Filled 2020-03-14: qty 1

## 2020-03-14 MED ORDER — ADULT MULTIVITAMIN W/MINERALS CH
1.0000 | ORAL_TABLET | Freq: Every day | ORAL | Status: DC
  Administered 2020-03-15: 1 via ORAL
  Filled 2020-03-14: qty 1

## 2020-03-14 MED ORDER — ZINC SULFATE 220 (50 ZN) MG PO CAPS
220.0000 mg | ORAL_CAPSULE | Freq: Every day | ORAL | Status: DC
  Administered 2020-03-15: 220 mg via ORAL
  Filled 2020-03-14: qty 1

## 2020-03-14 MED ORDER — ACETAMINOPHEN 325 MG PO TABS
650.0000 mg | ORAL_TABLET | Freq: Four times a day (QID) | ORAL | Status: DC | PRN
  Administered 2020-03-15: 650 mg via ORAL
  Filled 2020-03-14: qty 2

## 2020-03-14 MED ORDER — ENOXAPARIN SODIUM 40 MG/0.4ML ~~LOC~~ SOLN
40.0000 mg | SUBCUTANEOUS | Status: DC
  Administered 2020-03-14: 40 mg via SUBCUTANEOUS
  Filled 2020-03-14: qty 0.4

## 2020-03-14 MED ORDER — ONDANSETRON HCL 4 MG PO TABS: 4.0000 mg | ORAL_TABLET | Freq: Four times a day (QID) | ORAL | Status: DC | PRN

## 2020-03-14 MED ORDER — LACTATED RINGERS IV SOLN: INTRAVENOUS | Status: DC

## 2020-03-14 MED ORDER — ONDANSETRON HCL 4 MG/2ML IJ SOLN: 4.0000 mg | Freq: Four times a day (QID) | INTRAMUSCULAR | Status: DC | PRN

## 2020-03-14 MED ORDER — SODIUM CHLORIDE 0.9 % IV BOLUS
500.0000 mL | Freq: Once | INTRAVENOUS | Status: AC
  Administered 2020-03-14: 500 mL via INTRAVENOUS

## 2020-03-14 MED ORDER — SODIUM CHLORIDE 0.9 % IV SOLN
200.0000 mg | Freq: Once | INTRAVENOUS | Status: AC
  Administered 2020-03-14: 200 mg via INTRAVENOUS
  Filled 2020-03-14: qty 200

## 2020-03-14 NOTE — ED Notes (Addendum)
Patient ambulated in room. Pt's O2 remained in the mid 90s, however he was very unsteady and wobbly on his feet and had to be assisted. Patient states "I've never felt this unsteady before." Patient's oxygen saturation is 96% on RA at this time while sitting back in bed.

## 2020-03-14 NOTE — ED Provider Notes (Signed)
[facility]-EMERGENCY DEPT Provider Note   CSN: [id number] Arrival date & time: 03/14/20  1316     History Chief Complaint  Patient presents with  . Weakness    Dominic Goodwin is a 80 y.o. male past medical history of MI, type 2 diabetes, hypertension, hyperlipidemia, presenting to the emergency department from home with complaint of 2 weeks of poor appetite.  He states he has had poor appetite, is only drinking limited fluids throughout the day.  Reports 10 pound weight loss over the last few weeks.  He is also endorsing intermittent cough with intermittent shortness of breath.  He feels very weak at times to the point of collapsing without loss of consciousness.  He states his wife tested positive for Covid recently and thinks he has Covid as well.  The history is provided by the patient.       Past Medical History:  Diagnosis Date  . Coronary artery disease   . Dyspnea on exertion   . Elevated PSA   . History of stress test    Did not show evidence of ischemia, however, it showed somewhat diminished exercise tolerance and hypertensive response to exercise  . Hx of echocardiogram 09/22/2008   EF >55% showed normal left ventricular systolic functon and grade 1 diastolic dysfunction.  . Hyperlipidemia   . Hypertension   . MI (myocardial infarction) (HCC) 2007   which was treated with medical therapy.   . Sleep apnea   . Type 2 diabetes mellitus [facility])     Patient Active Problem List   Diagnosis Date Noted  . Atypical chest pain 05/14/2018  . Dyspnea on exertion 11/18/2013  . Essential hypertension 11/18/2013  . Hyperlipidemia 11/18/2013  . Diabetes (HCC) 11/18/2013  . Obstructive sleep apnea 11/18/2013    History reviewed. No pertinent surgical history.     History reviewed. No pertinent family history.  Social History   Tobacco Use  . Smoking status: Former Smoker    Quit date: 11/18/2000    Years since quitting: 19.3  . Smokeless tobacco:  Never Used  Substance Use Topics  . Alcohol use: No  . Drug use: No    Home Medications Prior to Admission medications   Medication Sig Start Date End Date Taking? Authorizing Provider  aspirin EC 81 MG tablet Take 81 mg by mouth daily.    [provider]  atorvastatin (LIPITOR) 80 MG tablet Take 1 tablet by mouth daily. 10/14/13   [provider]  cetirizine (ZYRTEC) 10 MG tablet Take 10 mg by mouth daily. 02/22/15   [provider]  Cholecalciferol (VITAMIN D-1000 MAX ST) 1000 UNITS tablet Take 1 tablet by mouth daily.    [provider]  finasteride (PROSCAR) 5 MG tablet Take 5 mg by mouth daily.    [provider]  glipiZIDE (GLUCOTROL) 5 MG tablet Take 5 mg by mouth 2 (two) times daily. 11/04/13   [provider]  isosorbide mononitrate (IMDUR) 30 MG 24 hr tablet Take 30 mg by mouth daily. 03/11/13   [provider]  losartan (COZAAR) 100 MG tablet Take 100 mg by mouth daily. 11/04/13   [provider]  metFORMIN (GLUCOPHAGE) 1000 MG tablet  05/08/17   [provider]  metoprolol tartrate (LOPRESSOR) 50 MG tablet Take 1 tablet (50 mg total) by mouth once for 1 dose. TAKE 1 TABLET 2 HOURS PRIOR TO YOUR CORONARY CTA 05/14/18 05/14/18  Runell Gess, MD  primidone (MYSOLINE) 50 MG tablet Take  1 tablet by mouth 2 (two) times daily. 07/25/15 07/24/16  [provider]  tamsulosin (FLOMAX) 0.4 MG CAPS capsule Take 1 capsule by mouth daily.    [provider]    Allergies    Patient has no known allergies.  Review of Systems   Review of Systems  Constitutional: Positive for appetite change and fatigue.  All other systems reviewed and are negative.   Physical Exam Updated Vital Signs BP (!) 148/76 (BP Location: Right Arm)   Pulse 97   Temp 97.9 F (36.6 C) (Oral)   Resp (!) 21   SpO2 95%   Physical Exam Vitals and nursing note reviewed.  Constitutional:      General: He is not in acute  distress.    Appearance: He is well-developed and well-nourished.  HENT:     Head: Normocephalic and atraumatic.  Eyes:     Conjunctiva/sclera: Conjunctivae normal.  Cardiovascular:     Rate and Rhythm: Normal rate and regular rhythm.  Pulmonary:     Effort: Pulmonary effort is normal. No respiratory distress.     Breath sounds: Normal breath sounds.  Abdominal:     General: A surgical scar is present. Bowel sounds are normal.     Palpations: Abdomen is soft.     Tenderness: There is no abdominal tenderness. There is no guarding or rebound.  Skin:    General: Skin is warm.  Neurological:     Mental Status: He is alert.  Psychiatric:        Mood and Affect: Mood and affect normal.        Behavior: Behavior normal.     ED Results / Procedures / Treatments   Labs (all labs ordered are listed, but only abnormal results are displayed) Labs Reviewed  CBC - Abnormal; Notable for the following components:      Result Value   RBC 4.19 (*)    Hemoglobin 12.9 (*)    HCT 38.6 (*)    All other components within normal limits  COMPREHENSIVE METABOLIC PANEL - Abnormal; Notable for the following components:   Glucose, Bld 138 (*)    Creatinine, Ser 1.32 (*)    Albumin 3.2 (*)    AST 100 (*)    ALT 107 (*)    GFR, Estimated 56 (*)    All other components within normal limits  D-DIMER, QUANTITATIVE (NOT AT [facility]) - Abnormal; Notable for the following components:   D-Dimer, Quant 1.93 (*)    All other components within normal limits  LACTATE DEHYDROGENASE - Abnormal; Notable for the following components:   LDH 367 (*)    All other components within normal limits  FERRITIN - Abnormal; Notable for the following components:   Ferritin 365 (*)    All other components within normal limits  TRIGLYCERIDES - Abnormal; Notable for the following components:   Triglycerides 168 (*)    All other components within normal limits  FIBRINOGEN - Abnormal; Notable for the following components:    Fibrinogen 779 (*)    All other components within normal limits  C-REACTIVE PROTEIN - Abnormal; Notable for the following components:   CRP 13.4 (*)    All other components within normal limits  CBG MONITORING, ED - Abnormal; Notable for the following components:   Glucose-Capillary 114 (*)    All other components within normal limits  POC SARS CORONAVIRUS 2 AG -  ED - Abnormal; Notable for the following components:   SARS Coronavirus 2 Ag POSITIVE (*)  All other components within normal limits  CULTURE, BLOOD (ROUTINE X 2)  CULTURE, BLOOD (ROUTINE X 2)  LACTIC ACID, PLASMA  PROCALCITONIN  URINALYSIS, ROUTINE W REFLEX MICROSCOPIC    EKG EKG Interpretation  Date/Time:  Monday March 14 2020 13:27:35 EST Ventricular Rate:  102 PR Interval:    QRS Duration: 77 QT Interval:  340 QTC Calculation: 443 R Axis:   43 Text Interpretation: Sinus tachycardia Nonspecific T abnormalities, lateral leads 12 Lead; [place] no prior ECG for comparison. NO STEMI Confirmed by Theda Belfast ([id number]) on 03/14/2020 4:05:09 PM   Radiology DG Chest Port 1 View  Result Date: 03/14/2020 CLINICAL DATA:  Cough COVID positive Weakness and difficulty breathing Decreased appetite EXAM: PORTABLE CHEST 1 VIEW COMPARISON:  None. FINDINGS: The heart size and mediastinal contours are within normal limits. Both lungs are clear. The visualized skeletal structures are unremarkable. IMPRESSION: No active disease. Electronically Signed   By: Acquanetta Belling M.D.   On: 03/14/2020 16:03    Procedures Procedures (including critical care time)  Medications Ordered in ED Medications  sodium chloride 0.9 % bolus 500 mL (0 mLs Intravenous Stopped 03/14/20 2039)    ED Course  I have reviewed the triage vital signs and the nursing notes.  Pertinent labs & imaging results that were available during my care of the patient were reviewed by me and considered in my medical decision making (see chart for details).    MDM  Rules/Calculators/A&P                          Patient presenting for 2 weeks of decreased appetite intermittent cough and intermittent shortness of breath.  He feels so weak at times that he collapses without loss of consciousness.  Reports 10 pound weight loss.  His wife is tested positive for Covid and thinks he has the same.  He presents today for his persistent loss of appetite, states he knows he needs to eat to live.  POC Covid antigen test is positive here.  He is afebrile, satting 94% on room air, speaking in full sentences.  Lung sounds are clear.  Chest x-ray is negative.  Laboratory work-up is ordered. Small IVF bolus ordered for hydration pending labs.   Patient's laboratory work-up shows mild transaminitis, mildly elevated creatinine, WBC within normal limits.  Additional inflammatory markers are elevated as well likely due to COVID-19 illness.  He is ambulated in the ED without hypoxia, however is extremely weak and unable to ambulate without assistance.  Considering patient's poor p.o. intake and being unable to ambulate, will admit for further management.  Dominic Goodwin was evaluated in Emergency Department on 03/14/2020 for the symptoms described in the history of present illness. He was evaluated in the context of the global COVID-19 pandemic, which necessitated consideration that the patient might be at risk for infection with the SARS-CoV-2 virus that causes COVID-19. Institutional protocols and algorithms that pertain to the evaluation of patients at risk for COVID-19 are in a state of rapid change based on information released by regulatory bodies including the CDC and federal and state organizations. These policies and algorithms were followed during the patient's care in the ED.  Final Clinical Impression(s) / ED Diagnoses Final diagnoses:  Generalized weakness  COVID-19    Rx / DC Orders ED Discharge Orders    None       Danyela Posas, [place] N, PA-C 03/14/20 2129     Tegeler, Canary Brim, MD 03/15/20 [phone]

## 2020-03-14 NOTE — ED Notes (Signed)
POC COVID POS GIVEN TO MESSICK 

## 2020-03-14 NOTE — H&P (Signed)
History and Physical   Hadi Dubin VCB:[id number] DOB: [birth year] DOA: 03/14/2020  Referring MD/NP/PA: [place] Robinson, PA, EDP PCP: Lenn Sink  Patient coming from: Home  Chief Complaint: Poor appetite, generalized weakness  HPI: Dominic Goodwin is a 80 y.o. male with a history of CAD, T2DM, OSA, HTN, HLD who presented to the ED with primary complaint of weakness that has been progressing for several weeks he says but much worse and associated with cough for the past couple days since his wife was diagnosed with covid-19. He reports many weeks of poor per oral intake due to poor appetite, not nausea, not vomiting, not abdominal pain or odynophagia or dysphagia. This has worsened recently and has been associated with weight loss.   ED Course: Afebrile without hypoxia and no infiltrates on CXR. Was very weak and off balance when attempting to ambulate to check pulse oximetry w/exertion per RN report which I confirmed, making DC home unsafe at this time.  ROS: Confirms recent cough, nonproductive, and intermittent dyspnea with exertion, weight loss unintentionally. No specific pain anywhere or changes in vision or hearing, headache, chest pain, palpitations, abdominal pain, nausea, vomiting, changes in bowel habits, blood in stool, change in bladder habits, myalgias, arthralgias, and rash, and per HPI. All others reviewed and are negative.   Past Medical History:  Diagnosis Date  . Coronary artery disease   . Dyspnea on exertion   . Elevated PSA   . History of stress test    Did not show evidence of ischemia, however, it showed somewhat diminished exercise tolerance and hypertensive response to exercise  . Hx of echocardiogram 09/22/2008   EF >55% showed normal left ventricular systolic functon and grade 1 diastolic dysfunction.  . Hyperlipidemia   . Hypertension   . MI (myocardial infarction) (HCC) 2007   which was treated with medical therapy.   . Sleep apnea   . Type 2 diabetes  mellitus (HCC)    History reviewed. No pertinent surgical history. - Previous smoker, lives with wife, no EtOH regularly or illicit drugs.   reports that he quit smoking about 19 years ago. He has never used smokeless tobacco. He reports that he does not drink alcohol and does not use drugs. No Known Allergies History reviewed. No pertinent family history. - Family history otherwise reviewed and not pertinent.  Prior to Admission medications   Medication Sig Start Date End Date Taking? Authorizing Provider  aspirin EC 81 MG tablet Take 81 mg by mouth daily.    [provider]  atorvastatin (LIPITOR) 80 MG tablet Take 1 tablet by mouth daily. 10/14/13   [provider]  cetirizine (ZYRTEC) 10 MG tablet Take 10 mg by mouth daily. 02/22/15   [provider]  Cholecalciferol (VITAMIN D-1000 MAX ST) 1000 UNITS tablet Take 1 tablet by mouth daily.    [provider]  finasteride (PROSCAR) 5 MG tablet Take 5 mg by mouth daily.    [provider]  glipiZIDE (GLUCOTROL) 5 MG tablet Take 5 mg by mouth 2 (two) times daily. 11/04/13   [provider]  isosorbide mononitrate (IMDUR) 30 MG 24 hr tablet Take 30 mg by mouth daily. 03/11/13   [provider]  losartan (COZAAR) 100 MG tablet Take 100 mg by mouth daily. 11/04/13   [provider]  metFORMIN (GLUCOPHAGE) 1000 MG tablet  05/08/17   [provider]  metoprolol tartrate (LOPRESSOR) 50 MG tablet Take 1 tablet (50 mg total) by mouth once for 1 dose.  TAKE 1 TABLET 2 HOURS PRIOR TO YOUR CORONARY CTA 05/14/18 05/14/18  Runell Gess, MD  primidone (MYSOLINE) 50 MG tablet Take 1 tablet by mouth 2 (two) times daily. 07/25/15 07/24/16  [provider]  tamsulosin (FLOMAX) 0.4 MG CAPS capsule Take 1 capsule by mouth daily.    [provider]    Physical Exam: Vitals:   03/14/20 1845 03/14/20 1900 03/14/20 2000 03/14/20 2046  BP:  (!) 156/91 134/82 (!) 148/76   Pulse: 88 87 92 97  Resp: 17 (!) 24 (!) 26 (!) 21  Temp:    97.9 F (36.6 C)  TempSrc:    Oral  SpO2: 95% 93% 95% 95%   Constitutional: Older male in no distress, calm demeanor Eyes: Lids and conjunctivae normal, PERRL ENMT: Mucous membranes are moist. Posterior pharynx clear of any exudate or lesions. Fair dentition.  Neck: normal, supple, no masses, no thyromegaly Respiratory: Non-labored breathing room air with mask on without accessory muscle use. Clear breath sounds to auscultation bilaterally Cardiovascular: Regular rate and rhythm, no murmurs, rubs, or gallops. No carotid bruits. No JVD. No pitting LE edema. Palpable pedal pulses. Abdomen: Normoactive bowel sounds. No tenderness, non-distended, and no masses palpated. No hepatosplenomegaly. GU: No indwelling catheter Musculoskeletal: No clubbing / cyanosis. No joint deformity upper and lower extremities. Good ROM, no contractures. Normal muscle tone.  Skin: Warm, dry. No rashes, wounds, or ulcers on visualized skin. Neurologic: CN II-XII grossly intact. Gait unsteady, wide based with tremulousness when rising from seated position. Falls backwards. Speech normal. No focal deficits in motor strength or sensation in all extremities.  Psychiatric: Alert and oriented x3. Unclear historian with seemingly intact judgment and insight. Mood euthymic with odd affect.   Labs on Admission: I have personally reviewed following labs and imaging studies  CBC: Recent Labs  Lab 03/14/20 1719  WBC 6.0  HGB 12.9*  HCT 38.6*  MCV 92.1  PLT 234   Basic Metabolic Panel: Recent Labs  Lab 03/14/20 1717  NA 137  K 3.9  CL 102  CO2 25  GLUCOSE 138*  BUN 22  CREATININE 1.32*  CALCIUM 9.0   GFR: CrCl cannot be calculated (Unknown ideal weight.). Liver Function Tests: Recent Labs  Lab 03/14/20 1717  AST 100*  ALT 107*  ALKPHOS 48  BILITOT 0.7  PROT 7.2  ALBUMIN 3.2*   No results for input(s): LIPASE, AMYLASE in the last 168  hours. No results for input(s): AMMONIA in the last 168 hours. Coagulation Profile: No results for input(s): INR, PROTIME in the last 168 hours. Cardiac Enzymes: No results for input(s): CKTOTAL, CKMB, CKMBINDEX, TROPONINI in the last 168 hours. BNP (last 3 results) No results for input(s): PROBNP in the last 8760 hours. HbA1C: No results for input(s): HGBA1C in the last 72 hours. CBG: Recent Labs  Lab 03/14/20 1751  GLUCAP 114*   Lipid Profile: Recent Labs    03/14/20 1717  TRIG 168*   Thyroid Function Tests: No results for input(s): TSH, T4TOTAL, FREET4, T3FREE, THYROIDAB in the last 72 hours. Anemia Panel: Recent Labs    03/14/20 1717  FERRITIN 365*   Urine analysis: No results found for: COLORURINE, APPEARANCEUR, LABSPEC, PHURINE, GLUCOSEU, HGBUR, BILIRUBINUR, KETONESUR, PROTEINUR, UROBILINOGEN, NITRITE, LEUKOCYTESUR  No results found for this or any previous visit (from the past 240 hour(s)).   Radiological Exams on Admission: DG Chest Port 1 View  Result Date: 03/14/2020 CLINICAL DATA:  Cough COVID positive Weakness and difficulty breathing Decreased appetite EXAM: PORTABLE  CHEST 1 VIEW COMPARISON:  None. FINDINGS: The heart size and mediastinal contours are within normal limits. Both lungs are clear. The visualized skeletal structures are unremarkable. IMPRESSION: No active disease. Electronically Signed   By: Miachel Roux M.D.   On: 03/14/2020 16:03   EKG: Independently reviewed. NSR without ST elevations  Assessment/Plan Principal Problem:   COVID-19 virus infection Active Problems:   Essential hypertension   Hyperlipidemia   Type 2 diabetes mellitus ([place])   Obstructive sleep apnea   Coronary artery disease   LFT elevation   Weakness   Covid-19 infection: Breakthrough infection with patient reporting 3 vaccinations, latest seems to have been [place] 12/21/2019. SARS-CoV-2 Ag positive on 1/3 worrisome for being early in disease process with significantly  elevated CRP but no hypoxia or infiltrates.  - Start remdesivir (May be more helpful early in disease course) - Does not qualify for steroids or immunomodulators at this time. Of note, his vaccination status would be relative contraindication to mAb Tx since such severe restrictions have been placed due to scarcity.   - Encourage OOB, IS, FV, and awake proning if able - Continue airborne, contact precautions.  - Monitor CMP and inflammatory markers. - Enoxaparin prophylactic dose.   Weakness: Without focal exam findings, suspect poor per oral intake and covid causing this.  - PT eval, treatment as above.  - Continue gentle IV fluids overnight. No cardiomegaly on XR.  Creatinine elevation: 1.32 on admission without prior for comparison. History would suggest this is AKI, though has RF's for CKD.  - Hydrate and follow - UA pending.   LFT elevation: Most likely related to covid-19 infection.  - Monitor  T2DM:  - Hold metformin, glipizide - Give SSI  OSA:  - Can use CPAP qHS.  HTN:  - Hold losartan.   BPH:  - Continue tamsulosin, finasteride if confirmed by medication reconciliation.  CAD: No angina, had essentially negative stress test June 2020. - Give imdur and metoprolol, statin, aspirin if confirmed by medication reconciliation.   DVT prophylaxis: Lovenox  Code Status: Full  Family Communication: None at bedside Disposition Plan: Return home if cleared by PT and weakness improves Consults called: None  Admission status: Observation    Patrecia Pour, MD [facility] [email or web address] 03/14/2020, 10:07 PM

## 2020-03-14 NOTE — ED Triage Notes (Signed)
Pt presents with c/o weakness and difficulty breathing. Pt reports his wife was exposed to covid and sent home from work so he feels like he has also been exposed. Pt reports a decreased appetite for several weeks as well.

## 2020-03-15 ENCOUNTER — Encounter (HOSPITAL_COMMUNITY): Payer: Self-pay | Admitting: Family Medicine

## 2020-03-15 ENCOUNTER — Observation Stay (HOSPITAL_COMMUNITY): Payer: No Typology Code available for payment source

## 2020-03-15 DIAGNOSIS — U071 COVID-19: Secondary | ICD-10-CM | POA: Diagnosis not present

## 2020-03-15 DIAGNOSIS — R7989 Other specified abnormal findings of blood chemistry: Secondary | ICD-10-CM

## 2020-03-15 DIAGNOSIS — I10 Essential (primary) hypertension: Secondary | ICD-10-CM

## 2020-03-15 DIAGNOSIS — R531 Weakness: Secondary | ICD-10-CM | POA: Diagnosis not present

## 2020-03-15 LAB — URINALYSIS, ROUTINE W REFLEX MICROSCOPIC
Bilirubin Urine: NEGATIVE
Glucose, UA: NEGATIVE mg/dL
Ketones, ur: 5 mg/dL — AB
Leukocytes,Ua: NEGATIVE
Nitrite: NEGATIVE
Protein, ur: 100 mg/dL — AB
Specific Gravity, Urine: 1.023 (ref 1.005–1.030)
pH: 5 (ref 5.0–8.0)

## 2020-03-15 LAB — COMPREHENSIVE METABOLIC PANEL
ALT: 109 U/L — ABNORMAL HIGH (ref 0–44)
AST: 100 U/L — ABNORMAL HIGH (ref 15–41)
Albumin: 2.8 g/dL — ABNORMAL LOW (ref 3.5–5.0)
Alkaline Phosphatase: 41 U/L (ref 38–126)
Anion gap: 12 (ref 5–15)
BUN: 21 mg/dL (ref 8–23)
CO2: 22 mmol/L (ref 22–32)
Calcium: 8.7 mg/dL — ABNORMAL LOW (ref 8.9–10.3)
Chloride: 102 mmol/L (ref 98–111)
Creatinine, Ser: 1.13 mg/dL (ref 0.61–1.24)
GFR, Estimated: >60 mL/min (ref >60)
Glucose, Bld: 134 mg/dL — ABNORMAL HIGH (ref 70–99)
Potassium: 3.6 mmol/L (ref 3.5–5.1)
Sodium: 136 mmol/L (ref 135–145)
Total Bilirubin: 0.8 mg/dL (ref 0.3–1.2)
Total Protein: 6.2 g/dL — ABNORMAL LOW (ref 6.5–8.1)

## 2020-03-15 LAB — D-DIMER, QUANTITATIVE: D-Dimer, Quant: 1.83 ug/mL-FEU — ABNORMAL HIGH (ref 0.00–0.50)

## 2020-03-15 LAB — C-REACTIVE PROTEIN: CRP: 13.1 mg/dL — ABNORMAL HIGH (ref <1.0)

## 2020-03-15 LAB — CBG MONITORING, ED
Glucose-Capillary: 105 mg/dL — ABNORMAL HIGH (ref 70–99)
Glucose-Capillary: 134 mg/dL — ABNORMAL HIGH (ref 70–99)

## 2020-03-15 MED ORDER — IOHEXOL 350 MG/ML SOLN
100.0000 mL | Freq: Once | INTRAVENOUS | Status: AC | PRN
  Administered 2020-03-15: 100 mL via INTRAVENOUS

## 2020-03-15 MED ORDER — DEXAMETHASONE 6 MG PO TABS: 6.0000 mg | ORAL_TABLET | Freq: Every day | ORAL | 0 refills | Status: DC

## 2020-03-15 MED ORDER — DEXAMETHASONE 4 MG PO TABS
6.0000 mg | ORAL_TABLET | Freq: Every day | ORAL | Status: DC
  Administered 2020-03-15: 6 mg via ORAL
  Filled 2020-03-15: qty 1

## 2020-03-15 MED ORDER — ZINC SULFATE 220 (50 ZN) MG PO CAPS: 220.0000 mg | ORAL_CAPSULE | Freq: Every day | ORAL | 0 refills | Status: DC

## 2020-03-15 NOTE — Discharge Summary (Signed)
Physician Discharge Summary  Macon Sandiford JME:[id number] DOB: 25-Jul-1940 DOA: 03/14/2020  PCP: [facility]  Admit date: 03/14/2020 Discharge date: 03/15/2020  Admitted From: Home Disposition: Home  Recommendations for Outpatient Follow-up:  1. Follow up with PCP in 1 week with repeat CBC/BMP 2. Follow up in ED if symptoms worsen or new appear   Home Health: No Equipment/Devices: None  Discharge Condition: Stable CODE STATUS: Full Diet recommendation: Heart healthy/carb modified  Brief/Interim Summary: 80 y.o. male with a history of CAD, T2DM, OSA, HTN, HLD who presented to the ED with primary complaint of progressively worsening weakness for several weeks along with cough the last couple of days since his wife was diagnosed with COVID-19.  On presentation, patient was afebrile and without hypoxia and no infiltrates seen on chest x-ray.  He was treated with some IV fluids.  Patient feels much better.  He is adamant about going home.  His inflammatory markers were elevated on presentation.  CTA chest was negative for PE but showed scattered groundglass opacities throughout both lungs. He will be discharged on oral Decadron with outpatient follow-up with remdesivir clinic to complete a course of remdesivir therapy.   Discharge Diagnoses:   COVID-19 infection Severe weakness -Patient has received 3 doses of vaccine for COVID-19; latest seems to be Pfizer vaccine on 12/21/2019. -Presented with worsening weakness.  Chest x-ray was negative for infiltrates.  Patient is not hypoxic.  CRP was elevated at 13.4 on presentation and is 13.1 today.  D-dimer 1.93 on presentation and 1.83 today. -He is very adamant about going home today. -He was treated with some IV fluids and patient feels much better.  PT evaluation is pending.  He does not want to wait for PT eval. -Currently still on room air with no respiratory symptoms. -Continue isolation at home -CTA chest was negative for PE but  showed scattered groundglass opacities throughout both lungs. -He will be discharged on oral Decadron (finish 10 day course) with outpatient follow-up with remdesivir clinic to complete a course of remdesivir therapy.  Mildly elevated creatinine -Improved with IV fluids.  Outpatient follow-up  Mildly elevated LFTs -Most likely secondary to COVID-19 infection.  Outpatient follow-up of LFTs  Diabetes mellitus type 2 -Blood sugars on the lower side.  Keep Metformin and glipizide on hold till reevaluation by PCP.  Carb modified diet  Hypertension -Resume home regimen.  Outpatient follow-up.  BPH -Continue home regimen  CAD -Stable.  Continue home regimen   Discharge Instructions Allergies as of 03/15/2020   No Known Allergies     Medication List    STOP taking these medications   glipiZIDE 10 MG 24 hr tablet Commonly known as: GLUCOTROL XL   metFORMIN 1000 MG tablet Commonly known as: GLUCOPHAGE   metoprolol tartrate 50 MG tablet Commonly known as: LOPRESSOR     TAKE these medications   acetaminophen 325 MG tablet Commonly known as: TYLENOL Take 325 mg by mouth 3 (three) times daily as needed for mild pain.   aspirin EC 81 MG tablet Take 81 mg by mouth daily.   atorvastatin 80 MG tablet Commonly known as: LIPITOR Take 80 mg by mouth daily.   carboxymethylcellul-glycerin 0.5-0.9 % ophthalmic solution Commonly known as: REFRESH OPTIVE Place 1 drop into both eyes 4 (four) times daily.   Cholecalciferol 25 MCG (1000 UT) tablet Take 1,000 Units by mouth daily.   dexamethasone 6 MG tablet Commonly known as: DECADRON Take 1 tablet (6 mg total) by mouth daily. Start taking on:  March 16, 2020   donepezil 10 MG tablet Commonly known as: ARICEPT Take 10 mg by mouth at bedtime.   finasteride 5 MG tablet Commonly known as: PROSCAR Take 5 mg by mouth daily.   isosorbide mononitrate 30 MG 24 hr tablet Commonly known as: IMDUR Take 30 mg by mouth daily.    losartan 100 MG tablet Commonly known as: COZAAR Take 100 mg by mouth daily.   mirabegron ER 50 MG Tb24 tablet Commonly known as: MYRBETRIQ Take 50 mg by mouth daily.   primidone 50 MG tablet Commonly known as: MYSOLINE Take 1 tablet by mouth 2 (two) times daily.   sodium chloride 0.65 % Soln nasal spray Commonly known as: OCEAN Place 2 sprays into both nostrils daily.   tamsulosin 0.4 MG Caps capsule Commonly known as: FLOMAX Take 0.4 mg by mouth daily.   zinc sulfate 220 (50 Zn) MG capsule Take 1 capsule (220 mg total) by mouth daily. Start taking on: March 16, 2020            Follow-up Information    [facility]. Schedule an appointment as soon as possible for a visit in 1 week(s).   Why: with repeat cbc/bmp Contact information: [place] [address] [phone]              No Known Allergies  Consultations:  None   Procedures/Studies: DG Chest Port 1 View  Result Date: 03/14/2020 CLINICAL DATA:  Cough COVID positive Weakness and difficulty breathing Decreased appetite EXAM: PORTABLE CHEST 1 VIEW COMPARISON:  None. FINDINGS: The heart size and mediastinal contours are within normal limits. Both lungs are clear. The visualized skeletal structures are unremarkable. IMPRESSION: No active disease. Electronically Signed   By: Miachel Roux M.D.   On: 03/14/2020 16:03       Subjective: Patient seen and examined at bedside.  He feels much better and is adamant about going home today.  Denies overnight fever, worsening shortness of breath, chest pain, abdominal pain or vomiting.  Discharge Exam: Vitals:   03/15/20 0700 03/15/20 1017  BP: 136/85 138/80  Pulse: 68 95  Resp: (!) 27 18  Temp:  98 F (36.7 C)  SpO2: 96% 94%    General: Pt is alert, awake, not in acute distress.  Currently on room air. Cardiovascular: rate controlled, S1/S2 + Respiratory: bilateral decreased breath sounds at bases with  some scattered crackles Abdominal: Soft, NT, ND, bowel sounds + Extremities: Trace lower extremity edema; no cyanosis    The results of significant diagnostics from this hospitalization (including imaging, microbiology, ancillary and laboratory) are listed below for reference.     Microbiology: Recent Results (from the past 240 hour(s))  Blood Culture (routine x 2)     Status: None (Preliminary result)   Collection Time: 03/14/20  6:30 PM   Specimen: Site Not Specified; Blood  Result Value Ref Range Status   Specimen Description   Final    SITE NOT SPECIFIED Performed at [facility] [address] [place], [place] [address]    Special Requests   Final    BOTTLES DRAWN AEROBIC AND ANAEROBIC Blood Culture adequate volume Performed at [facility] [address], [place], [place] [address]    Culture   Final    NO GROWTH < 12 HOURS Performed at [facility] [address], [place], [place] [address]    Report Status PENDING  Incomplete     Labs: BNP (  last 3 results) No results for input(s): BNP in the last 8760 hours. Basic Metabolic Panel: Recent Labs  Lab 03/14/20 1717 03/15/20 0414  NA 137 136  K 3.9 3.6  CL 102 102  CO2 25 22  GLUCOSE 138* 134*  BUN 22 21  CREATININE 1.32* 1.13  CALCIUM 9.0 8.7*   Liver Function Tests: Recent Labs  Lab 03/14/20 1717 03/15/20 0414  AST 100* 100*  ALT 107* 109*  ALKPHOS 48 41  BILITOT 0.7 0.8  PROT 7.2 6.2*  ALBUMIN 3.2* 2.8*   No results for input(s): LIPASE, AMYLASE in the last 168 hours. No results for input(s): AMMONIA in the last 168 hours. CBC: Recent Labs  Lab 03/14/20 1719  WBC 6.0  HGB 12.9*  HCT 38.6*  MCV 92.1  PLT 234   Cardiac Enzymes: No results for input(s): CKTOTAL, CKMB, CKMBINDEX, TROPONINI in the last 168 hours. BNP: Invalid input(s): POCBNP CBG: Recent Labs  Lab 03/14/20 1751 03/14/20 2303 03/15/20 0811  GLUCAP 114* 109* 105*    D-Dimer Recent Labs    03/14/20 1717 03/15/20 0414  DDIMER 1.93* 1.83*   Hgb A1c No results for input(s): HGBA1C in the last 72 hours. Lipid Profile Recent Labs    03/14/20 1717  TRIG 168*   Thyroid function studies No results for input(s): TSH, T4TOTAL, T3FREE, THYROIDAB in the last 72 hours.  Invalid input(s): FREET3 Anemia work up Recent Labs    03/14/20 1717  FERRITIN 365*   Urinalysis    Component Value Date/Time   COLORURINE YELLOW 03/14/2020 1719   APPEARANCEUR CLEAR 03/14/2020 1719   LABSPEC 1.023 03/14/2020 1719   PHURINE 5.0 03/14/2020 1719   GLUCOSEU NEGATIVE 03/14/2020 1719   HGBUR SMALL (A) 03/14/2020 1719   BILIRUBINUR NEGATIVE 03/14/2020 1719   KETONESUR 5 (A) 03/14/2020 1719   PROTEINUR 100 (A) 03/14/2020 1719   NITRITE NEGATIVE 03/14/2020 1719   LEUKOCYTESUR NEGATIVE 03/14/2020 1719   Sepsis Labs Invalid input(s): PROCALCITONIN,  WBC,  LACTICIDVEN Microbiology Recent Results (from the past 240 hour(s))  Blood Culture (routine x 2)     Status: None (Preliminary result)   Collection Time: 03/14/20  6:30 PM   Specimen: Site Not Specified; Blood  Result Value Ref Range Status   Specimen Description   Final    SITE NOT SPECIFIED Performed at [facility], [address], [place], [place] [address]    Special Requests   Final    BOTTLES DRAWN AEROBIC AND ANAEROBIC Blood Culture adequate volume Performed at [facility], [address], [place], [place] [address]    Culture   Final    NO GROWTH < 12 HOURS Performed at [facility], [address] [place], [place] [address]    Report Status PENDING  Incomplete     Time coordinating discharge: 35 minutes  SIGNED:   Glade Lloyd, MD  [facility] 03/15/2020, 11:52 AM

## 2020-03-15 NOTE — Progress Notes (Signed)
Patient scheduled for outpatient Remdesivir infusions at 2:30pm on Wednesday 1/5, Thursday 1/6, and Friday 1/7 at [facility]. Please inform the patient to park at [address] [place], [place], as staff will be escorting the patient through the east entrance of the hospital. Appointments take approximately 45 minutes.    There is a wave flag banner located near the entrance on [address]. Turn into this entrance and immediately turn left or right and park in 1 of the 10 designated Covid Infusion Parking spots. There is a phone number on the sign, please call and let the staff know what spot you are in and we will come out and get you. For questions call [phone].  Thanks.

## 2020-03-15 NOTE — ED Notes (Signed)
Breakfast tray given. °

## 2020-03-15 NOTE — Evaluation (Signed)
Physical Therapy Evaluation Patient Details Name: Dominic Goodwin MRN: [id number] DOB: Aug 10, 1940 Today's Date: 03/15/2020   History of Present Illness  Dominic Goodwin is a 80 y.o. male with a history of CAD, T2DM, OSA, HTN, HLD who presented to the ED with primary complaint of weakness that has been progressing for several weeks he says but much worse and associated with cough for the past couple days since his wife was diagnosed with covid-19. He reports many weeks of poor per oral intake due to poor appetite. In ED patient tested positive for COVID-19 infection: Breakthrough infection with patient reporting 3 vaccinations.    Clinical Impression  Dominic Goodwin is 80 y.o. male admitted with above HPI and diagnosis. Patient is currently limited by functional impairments below (see PT problem list). Patient lives with his wife and is independent at baseline. He currently requires min guard/supervision for mobility to manage lines safely while mobilizing in hospital room. Patient will benefit from continued skilled PT interventions to address impairments and progress independence with mobility. Acute PT will follow and progress as able; anticipate pt will not require follow up PT services when discharged home.      03/15/20 1100  PT Visit Information  Last PT Received On 03/15/20  Assistance Needed +1  History of Present Illness Dominic Goodwin is a 80 y.o. male with a history of CAD, T2DM, OSA, HTN, HLD who presented to the ED with primary complaint of weakness that has been progressing for several weeks he says but much worse and associated with cough for the past couple days since his wife was diagnosed with covid-19. He reports many weeks of poor per oral intake due to poor appetite. In ED patient tested positive for COVID-19 infection: Breakthrough infection with patient reporting 3 vaccinations.  Precautions  Precautions Fall  Restrictions  Weight Bearing Restrictions No  Home Living   Family/patient expects to be discharged to: Private residence  Living Arrangements Spouse/significant other  Available Help at Discharge Family  Type of Home House  Home Access Stairs to enter  Entrance Stairs-Number of Steps 1  Entrance Stairs-Rails None  Home Layout One level  Bathroom Shower/Tub Tub/shower unit;Walk-in Pension scheme manager Yes  Home Equipment [place] - single point;Walker - 2 wheels;Wheelchair - manual;Crutches;BSC;Shower seat  Additional Comments pt lives with his wife and his grandkids seomtimes stay to help around the house.  Prior Function  Level of Independence Independent  Comments pt is active and goes bowling 3-4x/week and mobilize without a device.  Communication  Communication No difficulties  Pain Assessment  Pain Assessment No/denies pain  Cognition  Arousal/Alertness Awake/alert  Behavior During Therapy WFL for tasks assessed/performed  Overall Cognitive Status Within Functional Limits for tasks assessed  Upper Extremity Assessment  Upper Extremity Assessment Overall WFL for tasks assessed  Lower Extremity Assessment  Lower Extremity Assessment Overall WFL for tasks assessed  Cervical / Trunk Assessment  Cervical / Trunk Assessment Normal  Bed Mobility  Overal bed mobility Needs Assistance  Bed Mobility Supine to Sit;Sit to Supine  Supine to sit Supervision  Sit to supine Supervision  General bed mobility comments no assist to mobilize, assist for line management.  Transfers  Overall transfer level Needs assistance  Equipment used Rolling walker (2 wheeled)  Transfers Sit to/from Stand  Sit to Stand Min guard;Supervision  General transfer comment guarding for safety and pt steady with rising.  Ambulation/Gait  Ambulation/Gait assistance Supervision;Min guard  Assistive device Rolling walker (2 wheeled)  Gait Pattern/deviations Step-through pattern;Decreased stride length  General Gait Details assist to  manage IV pole, patient steady with gait in room.  Gait velocity decr  Balance  Overall balance assessment Needs assistance  Sitting-balance support Feet supported  Sitting balance-Leahy Scale Good  Standing balance support During functional activity;No upper extremity supported  Standing balance-Leahy Scale Good  General Comments  General comments (skin integrity, edema, etc.) 5x Sit<>Stand: 20.2 seconds, no UE use for EOB for rise.  PT - End of Session  Activity Tolerance Patient tolerated treatment well  Patient left in bed;with call bell/phone within reach  Nurse Communication Mobility status  PT Assessment  PT Recommendation/Assessment Patient needs continued PT services  PT Visit Diagnosis Unsteadiness on feet (R26.81)  PT Problem List Decreased strength;Decreased activity tolerance;Decreased balance;Decreased mobility;Decreased knowledge of use of DME  PT Plan  PT Frequency (ACUTE ONLY) Min 3X/week  PT Treatment/Interventions (ACUTE ONLY) DME instruction;Gait training;Stair training;Functional mobility training;Therapeutic activities;Therapeutic exercise;Balance training;Patient/family education  AM-PAC PT "6 Clicks" Mobility Outcome Measure (Version 2)  Help needed turning from your back to your side while in a flat bed without using bedrails? 4  Help needed moving from lying on your back to sitting on the side of a flat bed without using bedrails? 4  Help needed moving to and from a bed to a chair (including a wheelchair)? 3  Help needed standing up from a chair using your arms (e.g., wheelchair or bedside chair)? 3  Help needed to walk in hospital room? 3  Help needed climbing 3-5 steps with a railing?  3  6 Click Score 20  Consider Recommendation of Discharge To: Home with no services  PT Recommendation  Follow Up Recommendations No PT follow up  PT equipment None recommended by PT  Individuals Consulted  Consulted and Agree with Results and Recommendations Patient   Acute Rehab PT Goals  PT Goal Formulation With patient  Time For Goal Achievement 03/22/20  Potential to Achieve Goals Good  PT Time Calculation  PT Start Time (ACUTE ONLY) 1146  PT Stop Time (ACUTE ONLY) 1204  PT Time Calculation (min) (ACUTE ONLY) 18 min  PT General Charges  $$ ACUTE PT VISIT 1 Visit  PT Evaluation  $PT Eval Low Complexity 1 Low  Written Expression  Dominant Hand Right     Wynn Maudlin, DPT Acute Rehabilitation Services Office [phone] Pager [phone]    Anitra Lauth 03/15/2020, 6:02 PM

## 2020-03-15 NOTE — Discharge Instructions (Signed)
You are scheduled for an outpatient Remdesivir infusion at 2:30pm on Wednesday 1/5, Thursday 1/6, and Friday 1/7 at [facility]. Please park at [address] [place], [place], as staff will be escorting you through the east entrance of the hospital. Appointments take approximately 45 minutes.    The address for the infusion clinic site is:  --GPS address is [address] - the parking is located near [employer] building where you will see  COVID19 Infusion feather banner marking the entrance to parking.   (see photos below)            --Enter into the 2nd entrance where the "wave, flag banner" is at the road. Turn into this 2nd entrance and immediately turn left to park in 1 of the 5 parking spots.   --Please stay in your car and call the desk for assistance inside [phone].   The day of your visit you should: [name] Get plenty of rest the night before and drink plenty of water . Eat a light meal/snack before coming and take your medications as prescribed  . Wear warm, comfortable clothes with a shirt that can roll-up over the elbow (will need IV start).  . Wear a mask  . Consider bringing some activity to help pass the time

## 2020-03-16 ENCOUNTER — Telehealth (HOSPITAL_COMMUNITY): Payer: Self-pay

## 2020-03-16 ENCOUNTER — Ambulatory Visit (HOSPITAL_COMMUNITY): Payer: Non-veteran care | Attending: Pulmonary Disease

## 2020-03-16 NOTE — Telephone Encounter (Signed)
Attempted to reach patient for missed remdesivir appointment. Unable to leave VM as it is not set up. Patient mychart is not set up either. Will mark as a no call no show.  Ellar Hakala Loyola Mast, RN

## 2020-03-17 ENCOUNTER — Ambulatory Visit (HOSPITAL_COMMUNITY): Admit: 2020-03-17 | Payer: Non-veteran care

## 2020-03-18 ENCOUNTER — Ambulatory Visit (HOSPITAL_COMMUNITY): Payer: Non-veteran care

## 2020-03-19 LAB — CULTURE, BLOOD (ROUTINE X 2)
Culture: NO GROWTH
Special Requests: ADEQUATE

## 2020-05-22 ENCOUNTER — Other Ambulatory Visit: Payer: Self-pay

## 2020-05-22 ENCOUNTER — Emergency Department (HOSPITAL_COMMUNITY): Payer: No Typology Code available for payment source

## 2020-05-22 ENCOUNTER — Inpatient Hospital Stay (HOSPITAL_COMMUNITY)
Admission: EM | Admit: 2020-05-22 | Discharge: 2020-05-24 | DRG: 069 | Disposition: A | Discharge status: Home Health | Payer: No Typology Code available for payment source | Attending: Family Medicine | Admitting: Family Medicine

## 2020-05-22 ENCOUNTER — Encounter (HOSPITAL_COMMUNITY): Payer: Self-pay | Admitting: *Deleted

## 2020-05-22 DIAGNOSIS — Z20822 Contact with and (suspected) exposure to covid-19: Secondary | ICD-10-CM | POA: Diagnosis present

## 2020-05-22 DIAGNOSIS — N400 Enlarged prostate without lower urinary tract symptoms: Secondary | ICD-10-CM | POA: Diagnosis present

## 2020-05-22 DIAGNOSIS — R29810 Facial weakness: Secondary | ICD-10-CM

## 2020-05-22 DIAGNOSIS — I2510 Atherosclerotic heart disease of native coronary artery without angina pectoris: Secondary | ICD-10-CM | POA: Diagnosis present

## 2020-05-22 DIAGNOSIS — Z87891 Personal history of nicotine dependence: Secondary | ICD-10-CM | POA: Diagnosis not present

## 2020-05-22 DIAGNOSIS — Z7902 Long term (current) use of antithrombotics/antiplatelets: Secondary | ICD-10-CM | POA: Diagnosis not present

## 2020-05-22 DIAGNOSIS — G459 Transient cerebral ischemic attack, unspecified: Principal | ICD-10-CM | POA: Diagnosis present

## 2020-05-22 DIAGNOSIS — I674 Hypertensive encephalopathy: Secondary | ICD-10-CM | POA: Diagnosis present

## 2020-05-22 DIAGNOSIS — I252 Old myocardial infarction: Secondary | ICD-10-CM

## 2020-05-22 DIAGNOSIS — G4730 Sleep apnea, unspecified: Secondary | ICD-10-CM | POA: Diagnosis present

## 2020-05-22 DIAGNOSIS — R471 Dysarthria and anarthria: Secondary | ICD-10-CM | POA: Diagnosis present

## 2020-05-22 DIAGNOSIS — I10 Essential (primary) hypertension: Secondary | ICD-10-CM | POA: Diagnosis present

## 2020-05-22 DIAGNOSIS — Z7982 Long term (current) use of aspirin: Secondary | ICD-10-CM | POA: Diagnosis not present

## 2020-05-22 DIAGNOSIS — I160 Hypertensive urgency: Secondary | ICD-10-CM | POA: Diagnosis present

## 2020-05-22 DIAGNOSIS — Z79899 Other long term (current) drug therapy: Secondary | ICD-10-CM

## 2020-05-22 DIAGNOSIS — E119 Type 2 diabetes mellitus without complications: Secondary | ICD-10-CM | POA: Diagnosis present

## 2020-05-22 DIAGNOSIS — E785 Hyperlipidemia, unspecified: Secondary | ICD-10-CM | POA: Diagnosis present

## 2020-05-22 DIAGNOSIS — Z7984 Long term (current) use of oral hypoglycemic drugs: Secondary | ICD-10-CM | POA: Diagnosis not present

## 2020-05-22 LAB — APTT: aPTT: 26 seconds (ref 24–36)

## 2020-05-22 LAB — COMPREHENSIVE METABOLIC PANEL
ALT: 15 U/L (ref 0–44)
AST: 21 U/L (ref 15–41)
Albumin: 3.5 g/dL (ref 3.5–5.0)
Alkaline Phosphatase: 54 U/L (ref 38–126)
Anion gap: 8 (ref 5–15)
BUN: 13 mg/dL (ref 8–23)
CO2: 26 mmol/L (ref 22–32)
Calcium: 9.1 mg/dL (ref 8.9–10.3)
Chloride: 101 mmol/L (ref 98–111)
Creatinine, Ser: 1.33 mg/dL — ABNORMAL HIGH (ref 0.61–1.24)
GFR, Estimated: 55 mL/min — ABNORMAL LOW (ref >60)
Glucose, Bld: 209 mg/dL — ABNORMAL HIGH (ref 70–99)
Potassium: 5 mmol/L (ref 3.5–5.1)
Sodium: 135 mmol/L (ref 135–145)
Total Bilirubin: 1.1 mg/dL (ref 0.3–1.2)
Total Protein: 6.3 g/dL — ABNORMAL LOW (ref 6.5–8.1)

## 2020-05-22 LAB — I-STAT CHEM 8, ED
BUN: 17 mg/dL (ref 8–23)
Calcium, Ion: 1.22 mmol/L (ref 1.15–1.40)
Chloride: 100 mmol/L (ref 98–111)
Creatinine, Ser: 1.1 mg/dL (ref 0.61–1.24)
Glucose, Bld: 210 mg/dL — ABNORMAL HIGH (ref 70–99)
HCT: 38 % — ABNORMAL LOW (ref 39.0–52.0)
Hemoglobin: 12.9 g/dL — ABNORMAL LOW (ref 13.0–17.0)
Potassium: 4.8 mmol/L (ref 3.5–5.1)
Sodium: 139 mmol/L (ref 135–145)
TCO2: 31 mmol/L (ref 22–32)

## 2020-05-22 LAB — DIFFERENTIAL
Abs Immature Granulocytes: 0.03 10*3/uL (ref 0.00–0.07)
Basophils Absolute: 0 10*3/uL (ref 0.0–0.1)
Basophils Relative: 0 %
Eosinophils Absolute: 0.1 10*3/uL (ref 0.0–0.5)
Eosinophils Relative: 1 %
Immature Granulocytes: 0 %
Lymphocytes Relative: 24 %
Lymphs Abs: 1.6 10*3/uL (ref 0.7–4.0)
Monocytes Absolute: 0.7 10*3/uL (ref 0.1–1.0)
Monocytes Relative: 10 %
Neutro Abs: 4.5 10*3/uL (ref 1.7–7.7)
Neutrophils Relative %: 65 %

## 2020-05-22 LAB — CBC
HCT: 38 % — ABNORMAL LOW (ref 39.0–52.0)
Hemoglobin: 12.3 g/dL — ABNORMAL LOW (ref 13.0–17.0)
MCH: 30.7 pg (ref 26.0–34.0)
MCHC: 32.4 g/dL (ref 30.0–36.0)
MCV: 94.8 fL (ref 80.0–100.0)
Platelets: 213 10*3/uL (ref 150–400)
RBC: 4.01 MIL/uL — ABNORMAL LOW (ref 4.22–5.81)
RDW: 13.6 % (ref 11.5–15.5)
WBC: 7 10*3/uL (ref 4.0–10.5)
nRBC: 0 % (ref 0.0–0.2)

## 2020-05-22 LAB — PROTIME-INR
INR: 1 (ref 0.8–1.2)
Prothrombin Time: 12.5 seconds (ref 11.4–15.2)

## 2020-05-22 LAB — CBG MONITORING, ED: Glucose-Capillary: 187 mg/dL — ABNORMAL HIGH (ref 70–99)

## 2020-05-22 MED ORDER — SODIUM CHLORIDE 0.9% FLUSH: 3.0000 mL | Freq: Once | INTRAVENOUS | Status: DC

## 2020-05-22 MED ORDER — IOHEXOL 350 MG/ML SOLN
80.0000 mL | Freq: Once | INTRAVENOUS | Status: AC | PRN
  Administered 2020-05-22: 80 mL via INTRAVENOUS

## 2020-05-22 MED ORDER — LABETALOL HCL 5 MG/ML IV SOLN
10.0000 mg | Freq: Once | INTRAVENOUS | Status: AC
  Administered 2020-05-23: 10 mg via INTRAVENOUS
  Filled 2020-05-22: qty 4

## 2020-05-22 NOTE — Code Documentation (Signed)
Stroke Response Nurse Documentation Code Documentation  Pancho Rushing is a 80 y.o. male arriving to [facility] ED via [facility] on 3/13 with past medical hx of HTN, MI, DMII. Code stroke was activated by EMS. Patient from home where he was LKW at 1000 on 3/12 and now complaining of Headache, dizzyness and right sided facial droop . On aspirin 81 mg daily. Stroke team at the bedside on patient arrival. Labs drawn and patient cleared for CT by Dr. Particia Nearing. Patient to CT with team. NIHSS 3, see documentation for details and code stroke times. Patient with right facial droop and dysarthria  on exam. The following imaging was completed:  CTA head and neck. Patient is not a candidate for tPA due to out of time window.Code stroke cancelled per Dr. Otelia Limes.  Bedside handoff with ED RN Norville Haggard  Rapid Response RN

## 2020-05-22 NOTE — ED Notes (Signed)
Stroke labs sent in stroke tube to station 99 by this RN

## 2020-05-22 NOTE — ED Notes (Signed)
No pain

## 2020-05-22 NOTE — ED Notes (Signed)
Charlie Pitter (Son) of [employer] called and ask for an update. Phone number [phone]

## 2020-05-22 NOTE — ED Provider Notes (Signed)
[place] EMERGENCY DEPARTMENT Provider Note   CSN: [id number] Arrival date & time: 05/22/20  2104  An emergency department physician performed an initial assessment on this suspected stroke patient at 2105.  History Chief Complaint  Patient presents with  . Code Stroke    Dominic Goodwin is a 80 y.o. male.  Pt presents to the ED today with a right sided facial droop.  Pt was LSN between 10 and 11 am yesterday.  The LSN time was unclear initially, so a code stroke was called.  The stroke team and myself met pt at the bridge and he was taken directly to the [facility].        Past Medical History:  Diagnosis Date  . Coronary artery disease   . Dyspnea on exertion   . Elevated PSA   . History of stress test    Did not show evidence of ischemia, however, it showed somewhat diminished exercise tolerance and hypertensive response to exercise  . Hx of echocardiogram 09/22/2008   EF >55% showed normal left ventricular systolic functon and grade 1 diastolic dysfunction.  . Hyperlipidemia   . Hypertension   . MI (myocardial infarction) ([place]) 2007   which was treated with medical therapy.   . Sleep apnea   . Type 2 diabetes mellitus [facility])     Patient Active Problem List   Diagnosis Date Noted  . COVID-19 virus infection 03/14/2020  . Coronary artery disease   . LFT elevation   . Weakness   . Atypical chest pain 05/14/2018  . Dyspnea on exertion 11/18/2013  . Essential hypertension 11/18/2013  . Hyperlipidemia 11/18/2013  . Type 2 diabetes mellitus ([place]) 11/18/2013  . Obstructive sleep apnea 11/18/2013    History reviewed. No pertinent surgical history.     No family history on file.  Social History   Tobacco Use  . Smoking status: Former Smoker    Quit date: 11/18/2000    Years since quitting: 19.5  . Smokeless tobacco: Never Used  Substance Use Topics  . Alcohol use: No  . Drug use: No    Home Medications Prior to Admission medications    Medication Sig Start Date End Date Taking? Authorizing Provider  acetaminophen (TYLENOL) 325 MG tablet Take 325 mg by mouth 3 (three) times daily as needed for mild pain.    [provider]  aspirin EC 81 MG tablet Take 81 mg by mouth daily.    [provider]  atorvastatin (LIPITOR) 80 MG tablet Take 80 mg by mouth daily. 10/14/13   [provider]  carboxymethylcellul-glycerin (REFRESH OPTIVE) 0.5-0.9 % ophthalmic solution Place 1 drop into both eyes 4 (four) times daily.    [provider]  Cholecalciferol 25 MCG (1000 UT) tablet Take 1,000 Units by mouth daily.    [provider]  dexamethasone (DECADRON) 6 MG tablet Take 1 tablet (6 mg total) by mouth daily. 03/16/20   Aline August, MD  donepezil (ARICEPT) 10 MG tablet Take 10 mg by mouth at bedtime.    [provider]  finasteride (PROSCAR) 5 MG tablet Take 5 mg by mouth daily.    [provider]  isosorbide mononitrate (IMDUR) 30 MG 24 hr tablet Take 30 mg by mouth daily. 03/11/13   [provider]  losartan (COZAAR) 100 MG tablet Take 100 mg by mouth daily. 11/04/13   [provider]  mirabegron ER (MYRBETRIQ) 50 MG TB24 tablet Take 50 mg by mouth daily.  [provider]  primidone (MYSOLINE) 50 MG tablet Take 1 tablet by mouth 2 (two) times daily. 07/25/15 03/15/20  [provider]  sodium chloride (OCEAN) 0.65 % SOLN nasal spray Place 2 sprays into both nostrils daily.    [provider]  tamsulosin (FLOMAX) 0.4 MG CAPS capsule Take 0.4 mg by mouth daily.    [provider]  zinc sulfate 220 (50 Zn) MG capsule Take 1 capsule (220 mg total) by mouth daily. 03/16/20   Aline August, MD    Allergies    Patient has no known allergies.  Review of Systems   Review of Systems  Neurological: Positive for weakness.  All other systems reviewed and are negative.   Physical Exam Updated Vital Signs BP (!) 211/110   Pulse 89    Temp 97.7 F (36.5 C)   Resp 17   Ht 5' 11"  (1.803 m)   Wt 109.8 kg   SpO2 97%   BMI 33.76 kg/m   Physical Exam Vitals and nursing note reviewed.  Constitutional:      Appearance: Normal appearance.  HENT:     Head: Normocephalic and atraumatic.     Right Ear: External ear normal.     Left Ear: External ear normal.     Nose: Nose normal.     Mouth/Throat:     Mouth: Mucous membranes are moist.     Pharynx: Oropharynx is clear.  Eyes:     Extraocular Movements: Extraocular movements intact.     Conjunctiva/sclera: Conjunctivae normal.     Pupils: Pupils are equal, round, and reactive to light.  Cardiovascular:     Rate and Rhythm: Normal rate and regular rhythm.     Pulses: Normal pulses.     Heart sounds: Normal heart sounds.  Pulmonary:     Effort: Pulmonary effort is normal.     Breath sounds: Normal breath sounds.  Abdominal:     General: Abdomen is flat. Bowel sounds are normal.     Palpations: Abdomen is soft.  Musculoskeletal:        General: Normal range of motion.     Cervical back: Normal range of motion and neck supple.  Skin:    General: Skin is warm.     Capillary Refill: Capillary refill takes less than 2 seconds.  Neurological:     General: No focal deficit present.     Mental Status: He is alert and oriented to person, place, and time.     Comments: Daughter feels like there is a right sided facial droop  Psychiatric:        Mood and Affect: Mood normal.        Behavior: Behavior normal.        Thought Content: Thought content normal.        Judgment: Judgment normal.     ED Results / Procedures / Treatments   Labs (all labs ordered are listed, but only abnormal results are displayed) Labs Reviewed  CBC - Abnormal; Notable for the following components:      Result Value   RBC 4.01 (*)    Hemoglobin 12.3 (*)    HCT 38.0 (*)    All other components within normal limits  COMPREHENSIVE METABOLIC PANEL - Abnormal; Notable for the following  components:   Glucose, Bld 209 (*)    Creatinine, Ser 1.33 (*)    Total Protein 6.3 (*)    GFR, Estimated 55 (*)    All other components within normal  limits  CBG MONITORING, ED - Abnormal; Notable for the following components:   Glucose-Capillary 187 (*)    All other components within normal limits  I-STAT CHEM 8, ED - Abnormal; Notable for the following components:   Glucose, Bld 210 (*)    Hemoglobin 12.9 (*)    HCT 38.0 (*)    All other components within normal limits  RESP PANEL BY RT-PCR (FLU A&B, COVID) [id number]  PROTIME-INR  APTT  DIFFERENTIAL  CBG MONITORING, ED    EKG EKG Interpretation  Date/Time:  Sunday May 22 2020 21:55:17 EDT Ventricular Rate:  95 PR Interval:    QRS Duration: 81 QT Interval:  355 QTC Calculation: 447 R Axis:   45 Text Interpretation: Sinus rhythm Prolonged PR interval No significant change since last tracing Confirmed by Isla Pence [phone]) on 05/22/2020 10:03:51 PM   Radiology CT ANGIO HEAD W OR WO CONTRAST  Result Date: 05/22/2020 CLINICAL DATA:  Left facial droop and headache EXAM: CT ANGIOGRAPHY HEAD AND NECK TECHNIQUE: Multidetector CT imaging of the head and neck was performed using the standard protocol during bolus administration of intravenous contrast. Multiplanar CT image reconstructions and MIPs were obtained to evaluate the vascular anatomy. Carotid stenosis measurements (when applicable) are obtained utilizing NASCET criteria, using the distal internal carotid diameter as the denominator. CONTRAST:  2m OMNIPAQUE IOHEXOL 350 MG/ML SOLN COMPARISON:  None. FINDINGS: CTA NECK FINDINGS SKELETON: There is no bony spinal canal stenosis. No lytic or blastic lesion. OTHER NECK: Normal pharynx, larynx and major salivary glands. No cervical lymphadenopathy. Unremarkable thyroid gland. UPPER CHEST: No pneumothorax or pleural effusion. No nodules or masses. AORTIC ARCH: There is calcific atherosclerosis of the aortic arch. There is no  aneurysm, dissection or hemodynamically significant stenosis of the visualized portion of the aorta. Conventional 3 vessel aortic branching pattern. The visualized proximal subclavian arteries are widely patent. RIGHT CAROTID SYSTEM: Normal without aneurysm, dissection or stenosis. LEFT CAROTID SYSTEM: Normal without aneurysm, dissection or stenosis. VERTEBRAL ARTERIES: Right dominant configuration. Both origins are clearly patent. There is no dissection, occlusion or flow-limiting stenosis to the skull base (V1-V3 segments). CTA HEAD FINDINGS POSTERIOR CIRCULATION: --Vertebral arteries: Normal V4 segments. --Inferior cerebellar arteries: Normal. --Basilar artery: Normal. --Superior cerebellar arteries: Normal. --Posterior cerebral arteries (PCA): Normal. ANTERIOR CIRCULATION: --Intracranial internal carotid arteries: Normal. --Anterior cerebral arteries (ACA): Normal. Both A1 segments are present. Patent anterior communicating artery (a-comm). --Middle cerebral arteries (MCA): Normal. VENOUS SINUSES: As permitted by contrast timing, patent. ANATOMIC VARIANTS: None Review of the MIP images confirms the above findings. IMPRESSION: Normal CTA of the head and neck. Aortic atherosclerosis (ICD10-I70.0). Electronically Signed   By: KUlyses JarredM.D.   On: 05/22/2020 22:03   CT ANGIO NECK W OR WO CONTRAST  Result Date: 05/22/2020 CLINICAL DATA:  Left facial droop and headache EXAM: CT ANGIOGRAPHY HEAD AND NECK TECHNIQUE: Multidetector CT imaging of the head and neck was performed using the standard protocol during bolus administration of intravenous contrast. Multiplanar CT image reconstructions and MIPs were obtained to evaluate the vascular anatomy. Carotid stenosis measurements (when applicable) are obtained utilizing NASCET criteria, using the distal internal carotid diameter as the denominator. CONTRAST:  879mOMNIPAQUE IOHEXOL 350 MG/ML SOLN COMPARISON:  None. FINDINGS: CTA NECK FINDINGS SKELETON: There is no  bony spinal canal stenosis. No lytic or blastic lesion. OTHER NECK: Normal pharynx, larynx and major salivary glands. No cervical lymphadenopathy. Unremarkable thyroid gland. UPPER CHEST: No pneumothorax or pleural effusion. No nodules or masses. AORTIC ARCH: There is  calcific atherosclerosis of the aortic arch. There is no aneurysm, dissection or hemodynamically significant stenosis of the visualized portion of the aorta. Conventional 3 vessel aortic branching pattern. The visualized proximal subclavian arteries are widely patent. RIGHT CAROTID SYSTEM: Normal without aneurysm, dissection or stenosis. LEFT CAROTID SYSTEM: Normal without aneurysm, dissection or stenosis. VERTEBRAL ARTERIES: Right dominant configuration. Both origins are clearly patent. There is no dissection, occlusion or flow-limiting stenosis to the skull base (V1-V3 segments). CTA HEAD FINDINGS POSTERIOR CIRCULATION: --Vertebral arteries: Normal V4 segments. --Inferior cerebellar arteries: Normal. --Basilar artery: Normal. --Superior cerebellar arteries: Normal. --Posterior cerebral arteries (PCA): Normal. ANTERIOR CIRCULATION: --Intracranial internal carotid arteries: Normal. --Anterior cerebral arteries (ACA): Normal. Both A1 segments are present. Patent anterior communicating artery (a-comm). --Middle cerebral arteries (MCA): Normal. VENOUS SINUSES: As permitted by contrast timing, patent. ANATOMIC VARIANTS: None Review of the MIP images confirms the above findings. IMPRESSION: Normal CTA of the head and neck. Aortic atherosclerosis (ICD10-I70.0). Electronically Signed   By: Ulyses Jarred M.D.   On: 05/22/2020 22:03   CT HEAD CODE STROKE WO CONTRAST  Result Date: 05/22/2020 CLINICAL DATA:  Code stroke.  Left facial droop and dizziness EXAM: CT HEAD WITHOUT CONTRAST TECHNIQUE: Contiguous axial images were obtained from the base of the skull through the vertex without intravenous contrast. COMPARISON:  None. FINDINGS: Brain: There is no mass,  hemorrhage or extra-axial collection. The size and configuration of the ventricles and extra-axial CSF spaces are normal. There is hypoattenuation of the periventricular white matter, most commonly indicating chronic ischemic microangiopathy. Vascular: Atherosclerotic calcification of the internal carotid arteries at the skull base. No abnormal hyperdensity of the major intracranial arteries or dural venous sinuses. Skull: The visualized skull base, calvarium and extracranial soft tissues are normal. Sinuses/Orbits: No fluid levels or advanced mucosal thickening of the visualized paranasal sinuses. No mastoid or middle ear effusion. The orbits are normal. ASPECTS [facility] Stroke Program Early CT Score) - Ganglionic level infarction (caudate, lentiform nuclei, internal capsule, insula, M1-M3 cortex): 7 - Supraganglionic infarction (M4-M6 cortex): 3 Total score (0-10 with 10 being normal): 10 IMPRESSION: 1. Chronic ischemic microangiopathy without acute intracranial abnormality. 2. ASPECTS is 10. These results were communicated to Dr. Kerney Elbe at 9:15 pm on 05/22/2020 by text page via the [facility] messaging system. Electronically Signed   By: Ulyses Jarred M.D.   On: 05/22/2020 21:16    Procedures Procedures   Medications Ordered in ED Medications  sodium chloride flush (NS) 0.9 % injection 3 mL (has no administration in time range)  labetalol (NORMODYNE) injection 10 mg (has no administration in time range)  iohexol (OMNIPAQUE) 350 MG/ML injection 80 mL (80 mLs Intravenous Contrast Given 05/22/20 2149)    ED Course  I have reviewed the triage vital signs and the nursing notes.  Pertinent labs & imaging results that were available during my care of the patient were reviewed by me and considered in my medical decision making (see chart for details).    MDM Rules/Calculators/A&P                          CT head and CTA head/neck nl.  Pt is out of the window for tpa and IR intervention.  Code stroke  canceled by Dr. Cheral Marker (neuro).  BP is significantly elevated.  This may be contributing to his sx.  Labetalol ordered as neuro wants [place] to treat the bp.  Dr. Cheral Marker recommends MRI and admission for stroke work up.  Pt  d/w Dr. Tonie Griffith (triad) who will admit.  CRITICAL CARE Performed by: Isla Pence   Total critical care time: 30 minutes  Critical care time was exclusive of separately billable procedures and treating other patients.  Critical care was necessary to treat or prevent imminent or life-threatening deterioration.  Critical care was time spent personally by me on the following activities: development of treatment plan with patient and/or surrogate as well as nursing, discussions with consultants, evaluation of patient's response to treatment, examination of patient, obtaining history from patient or surrogate, ordering and performing treatments and interventions, ordering and review of laboratory studies, ordering and review of radiographic studies, pulse oximetry and re-evaluation of patient's condition.  Final Clinical Impression(s) / ED Diagnoses Final diagnoses:  Hypertensive encephalopathy  Facial droop    Rx / DC Orders ED Discharge Orders    None       Isla Pence, MD 05/22/20 2254

## 2020-05-22 NOTE — ED Triage Notes (Signed)
The pt arrived by gems  With code stroke symptoms  Facial droop     lnw was yesterday after receiving misleading times and symptoms   Code stroke cancelled when the pt came back from c-t alert oriented skin warm and dry  No pain no distress

## 2020-05-22 NOTE — ED Notes (Signed)
To mri 

## 2020-05-22 NOTE — ED Notes (Signed)
Son would like a callback with an update when possible [place] [phone]

## 2020-05-23 ENCOUNTER — Inpatient Hospital Stay (HOSPITAL_COMMUNITY): Payer: No Typology Code available for payment source

## 2020-05-23 ENCOUNTER — Encounter (HOSPITAL_COMMUNITY): Payer: Self-pay | Admitting: Family Medicine

## 2020-05-23 DIAGNOSIS — R29810 Facial weakness: Secondary | ICD-10-CM

## 2020-05-23 DIAGNOSIS — G459 Transient cerebral ischemic attack, unspecified: Secondary | ICD-10-CM

## 2020-05-23 DIAGNOSIS — I160 Hypertensive urgency: Secondary | ICD-10-CM

## 2020-05-23 DIAGNOSIS — I674 Hypertensive encephalopathy: Secondary | ICD-10-CM | POA: Insufficient documentation

## 2020-05-23 LAB — CBG MONITORING, ED
Glucose-Capillary: 109 mg/dL — ABNORMAL HIGH (ref 70–99)
Glucose-Capillary: 150 mg/dL — ABNORMAL HIGH (ref 70–99)
Glucose-Capillary: 107 mg/dL — ABNORMAL HIGH (ref 70–99)

## 2020-05-23 LAB — ECHOCARDIOGRAM COMPLETE
Area-P 1/2: 2.91 cm2
Height: 71 in
S' Lateral: 3.8 cm
Weight: 3873.04 oz

## 2020-05-23 LAB — RESP PANEL BY RT-PCR (FLU A&B, COVID) ARPGX2
Influenza A by PCR: NEGATIVE
Influenza B by PCR: NEGATIVE
SARS Coronavirus 2 by RT PCR: NEGATIVE

## 2020-05-23 LAB — CBC
HCT: 36.9 % — ABNORMAL LOW (ref 39.0–52.0)
Hemoglobin: 11.8 g/dL — ABNORMAL LOW (ref 13.0–17.0)
MCH: 30.3 pg (ref 26.0–34.0)
MCHC: 32 g/dL (ref 30.0–36.0)
MCV: 94.9 fL (ref 80.0–100.0)
Platelets: 217 10*3/uL (ref 150–400)
RBC: 3.89 MIL/uL — ABNORMAL LOW (ref 4.22–5.81)
RDW: 13.5 % (ref 11.5–15.5)
WBC: 6.8 10*3/uL (ref 4.0–10.5)
nRBC: 0 % (ref 0.0–0.2)

## 2020-05-23 LAB — BASIC METABOLIC PANEL
Anion gap: 11 (ref 5–15)
BUN: 10 mg/dL (ref 8–23)
CO2: 24 mmol/L (ref 22–32)
Calcium: 9.4 mg/dL (ref 8.9–10.3)
Chloride: 101 mmol/L (ref 98–111)
Creatinine, Ser: 1.14 mg/dL (ref 0.61–1.24)
GFR, Estimated: >60 mL/min (ref >60)
Glucose, Bld: 114 mg/dL — ABNORMAL HIGH (ref 70–99)
Potassium: 3.9 mmol/L (ref 3.5–5.1)
Sodium: 136 mmol/L (ref 135–145)

## 2020-05-23 LAB — HEMOGLOBIN A1C
Hgb A1c MFr Bld: 6.7 % — ABNORMAL HIGH (ref 4.8–5.6)
Mean Plasma Glucose: 145.59 mg/dL

## 2020-05-23 LAB — LIPID PANEL
Cholesterol: 106 mg/dL (ref 0–200)
HDL: 32 mg/dL — ABNORMAL LOW (ref >40)
LDL Cholesterol: 64 mg/dL (ref 0–99)
Total CHOL/HDL Ratio: 3.3 RATIO
Triglycerides: 51 mg/dL (ref <150)
VLDL: 10 mg/dL (ref 0–40)

## 2020-05-23 MED ORDER — SODIUM CHLORIDE 0.9 % IV SOLN: INTRAVENOUS | Status: DC

## 2020-05-23 MED ORDER — DONEPEZIL HCL 10 MG PO TABS
10.0000 mg | ORAL_TABLET | Freq: Every day | ORAL | Status: DC
  Administered 2020-05-23: 10 mg via ORAL
  Filled 2020-05-23 (×2): qty 1

## 2020-05-23 MED ORDER — ACETAMINOPHEN 650 MG RE SUPP: 650.0000 mg | RECTAL | Status: DC | PRN

## 2020-05-23 MED ORDER — INSULIN ASPART 100 UNIT/ML ~~LOC~~ SOLN: 0.0000 [IU] | Freq: Every day | SUBCUTANEOUS | Status: DC

## 2020-05-23 MED ORDER — STROKE: EARLY STAGES OF RECOVERY BOOK: Freq: Once | Status: DC

## 2020-05-23 MED ORDER — ISOSORBIDE MONONITRATE ER 30 MG PO TB24
30.0000 mg | ORAL_TABLET | Freq: Every day | ORAL | Status: DC
  Administered 2020-05-23 – 2020-05-24 (×2): 30 mg via ORAL
  Filled 2020-05-23 (×2): qty 1

## 2020-05-23 MED ORDER — ACETAMINOPHEN 325 MG PO TABS: 650.0000 mg | ORAL_TABLET | ORAL | Status: DC | PRN

## 2020-05-23 MED ORDER — INSULIN ASPART 100 UNIT/ML ~~LOC~~ SOLN
0.0000 [IU] | Freq: Three times a day (TID) | SUBCUTANEOUS | Status: DC
  Administered 2020-05-23 – 2020-05-24 (×2): 2 [IU] via SUBCUTANEOUS

## 2020-05-23 MED ORDER — ASPIRIN EC 81 MG PO TBEC
81.0000 mg | DELAYED_RELEASE_TABLET | Freq: Every day | ORAL | Status: DC
  Administered 2020-05-23 – 2020-05-24 (×2): 81 mg via ORAL
  Filled 2020-05-23 (×2): qty 1

## 2020-05-23 MED ORDER — TAMSULOSIN HCL 0.4 MG PO CAPS
0.4000 mg | ORAL_CAPSULE | Freq: Every day | ORAL | Status: DC
  Administered 2020-05-23 – 2020-05-24 (×2): 0.4 mg via ORAL
  Filled 2020-05-23 (×2): qty 1

## 2020-05-23 MED ORDER — ENOXAPARIN SODIUM 40 MG/0.4ML ~~LOC~~ SOLN
40.0000 mg | SUBCUTANEOUS | Status: DC
  Administered 2020-05-23 – 2020-05-24 (×2): 40 mg via SUBCUTANEOUS
  Filled 2020-05-23 (×2): qty 0.4

## 2020-05-23 MED ORDER — ACETAMINOPHEN 160 MG/5ML PO SOLN: 650.0000 mg | ORAL | Status: DC | PRN

## 2020-05-23 MED ORDER — ATORVASTATIN CALCIUM 80 MG PO TABS
80.0000 mg | ORAL_TABLET | Freq: Every day | ORAL | Status: DC
  Administered 2020-05-23 – 2020-05-24 (×2): 80 mg via ORAL
  Filled 2020-05-23 (×2): qty 1

## 2020-05-23 MED ORDER — LOSARTAN POTASSIUM 50 MG PO TABS
100.0000 mg | ORAL_TABLET | Freq: Every day | ORAL | Status: DC
  Administered 2020-05-23 – 2020-05-24 (×2): 100 mg via ORAL
  Filled 2020-05-23 (×2): qty 2

## 2020-05-23 NOTE — Progress Notes (Signed)
  Echocardiogram 2D Echocardiogram has been performed.  Dominic Goodwin 05/23/2020, 2:51 PM

## 2020-05-23 NOTE — ED Notes (Signed)
Pt son updated on pt status

## 2020-05-23 NOTE — Evaluation (Signed)
Physical Therapy Evaluation and Discharge Patient Details Name: Dominic Goodwin MRN: [id number] DOB: [birth year] Today's Date: 05/23/2020   History of Present Illness  Pt is a 80 y/o male admitted 3/13 secondary to speech abnormalities and drooping of right side of mouth. MRI negative. Thought to be secondary to hypertensive emergency. PMH includes DM, CAD, HTN, and MI.  Clinical Impression  Pt admitted secondary to problem above with deficits below. Overall steady with mobility requiring supervision for safety. No overt LOB noted with balance challenges. Pt currently lives alone, but has brother close by. Per RN, pt's son is concerned that pt is having difficulty taking meds and caring for himself at home. Recommend HHRN and HHPT for safety eval to determine appropriate needs at home. No further acute PT needs at this time as pt reports he is close to his baseline. Will sign off. If needs change, please re-consult.      Follow Up Recommendations Home health PT (For safety eval and HHRN)    Equipment Recommendations  None recommended by PT    Recommendations for Other Services       Precautions / Restrictions Precautions Precautions: None Restrictions Weight Bearing Restrictions: No      Mobility  Bed Mobility               General bed mobility comments: Up in bathroom in ED    Transfers Overall transfer level: Modified independent                  Ambulation/Gait Ambulation/Gait assistance: Supervision Gait Distance (Feet): 150 Feet Assistive device: None Gait Pattern/deviations: Step-through pattern;Decreased stride length Gait velocity: Decreased   General Gait Details: Mildly slower gait speed. No LOB noted. Able to perform horizontal and vertical head turns without LOB.  Stairs            Wheelchair Mobility    Modified Rankin (Stroke Patients Only)       Balance Overall balance assessment: No apparent balance deficits (not formally  assessed)                                           Pertinent Vitals/Pain Pain Assessment: No/denies pain    Home Living Family/patient expects to be discharged to:: Private residence Living Arrangements: Alone Available Help at Discharge: Family Type of Home: House Home Access: Stairs to enter Entrance Stairs-Rails: None Entrance Stairs-Number of Steps: 1 Home Layout: One level Home Equipment: Cane - single point;Walker - 2 wheels;Wheelchair - manual;Crutches;Bedside commode;Shower seat Additional Comments: Pt's wife recently died. Per RN, pt's son concerned about pt taking his med and interested in [facility] services.    Prior Function Level of Independence: Independent               Hand Dominance        Extremity/Trunk Assessment   Upper Extremity Assessment Upper Extremity Assessment: Defer to OT evaluation    Lower Extremity Assessment Lower Extremity Assessment: Overall WFL for tasks assessed    Cervical / Trunk Assessment Cervical / Trunk Assessment: Normal  Communication   Communication: HOH  Cognition Arousal/Alertness: Awake/alert Behavior During Therapy: WFL for tasks assessed/performed Overall Cognitive Status: No family/caregiver present to determine baseline cognitive functioning  General Comments: Unsure of pt's baseline. Uses humor to cover some deficits.      General Comments      Exercises     Assessment/Plan    PT Assessment Patent does not need any further PT services;All further PT needs can be met in the next venue of care  PT Problem List Decreased mobility;Decreased safety awareness       PT Treatment Interventions      PT Goals (Current goals can be found in the Care Plan section)  Acute Rehab PT Goals Patient Stated Goal: to go home PT Goal Formulation: With patient Time For Goal Achievement: 05/23/20 Potential to Achieve Goals: Good    Frequency      Barriers to discharge        Co-evaluation               AM-PAC PT "6 Clicks" Mobility  Outcome Measure Help needed turning from your back to your side while in a flat bed without using bedrails?: None Help needed moving from lying on your back to sitting on the side of a flat bed without using bedrails?: None Help needed moving to and from a bed to a chair (including a wheelchair)?: None Help needed standing up from a chair using your arms (e.g., wheelchair or bedside chair)?: None Help needed to walk in hospital room?: None Help needed climbing 3-5 steps with a railing? : A Little 6 Click Score: 23    End of Session Equipment Utilized During Treatment: Gait belt Activity Tolerance: Patient tolerated treatment well Patient left: in bed;with call bell/phone within reach (on stretcher in ED) Nurse Communication: Mobility status PT Visit Diagnosis: Other abnormalities of gait and mobility (R26.89)    Time: 1437-1450 PT Time Calculation (min) (ACUTE ONLY): 13 min   Charges:   PT Evaluation $PT Eval Low Complexity: 1 Low          Lou Miner, DPT  Acute Rehabilitation Services  Pager: [phone] Office: [phone]   Rudean Hitt 05/23/2020, 4:06 PM

## 2020-05-23 NOTE — Consult Note (Signed)
Referring Physician: Dr. Particia Nearing    Chief Complaint: Right facial droop  HPI: Dominic Goodwin is an 80 y.o. male with a PMHx of HTN, DM2, CAD, HLD, sleep apnea and dyspnea on exertion who presented as a Code Stroke after right sided facial droop was noticed at home. This occurred in the context of a headache and symptom of dizziness. LKN was between 10 and 11 AM on 3/12. He is on aspirin 81 mg daily. Patient was sent for a STAT CT head on arrival, with no acute abnormality noted. NIHSS was 3 with right facial droop and dysarthria. Subsequent exam showed resolution of the facial droop. Patient is not a candidate for tPA due to being out of the time window.   Home medications include ASA and atorvastatin.   LSN: Between 10 and 11 AM on Saturday tPA Given: No: Minimal deficits on exam and out of the time window NIHSS: 3 mRS: 0  Past Medical History:  Diagnosis Date  . Coronary artery disease   . Dyspnea on exertion   . Elevated PSA   . History of stress test    Did not show evidence of ischemia, however, it showed somewhat diminished exercise tolerance and hypertensive response to exercise  . Hx of echocardiogram 09/22/2008   EF >55% showed normal left ventricular systolic functon and grade 1 diastolic dysfunction.  . Hyperlipidemia   . Hypertension   . MI (myocardial infarction) (HCC) 2007   which was treated with medical therapy.   . Sleep apnea   . Type 2 diabetes mellitus (HCC)     History reviewed. No pertinent surgical history.  History reviewed. No pertinent family history. Social History:  reports that he quit smoking about 19 years ago. He has never used smokeless tobacco. He reports that he does not drink alcohol and does not use drugs.  Allergies: No Known Allergies  Medications:  No current facility-administered medications on file prior to encounter.   Current Outpatient Medications on File Prior to Encounter  Medication Sig Dispense Refill  . acetaminophen  (TYLENOL) 325 MG tablet Take 325 mg by mouth 3 (three) times daily as needed for mild pain.    [name] aspirin EC 81 MG tablet Take 81 mg by mouth daily.    [name] atorvastatin (LIPITOR) 80 MG tablet Take 80 mg by mouth daily.    . carboxymethylcellul-glycerin (REFRESH OPTIVE) 0.5-0.9 % ophthalmic solution Place 1 drop into both eyes 4 (four) times daily.    . Cholecalciferol 25 MCG (1000 UT) tablet Take 1,000 Units by mouth daily.    [name] dexamethasone (DECADRON) 6 MG tablet Take 1 tablet (6 mg total) by mouth daily. 9 tablet 0  . donepezil (ARICEPT) 10 MG tablet Take 10 mg by mouth at bedtime.    . finasteride (PROSCAR) 5 MG tablet Take 5 mg by mouth daily.    . isosorbide mononitrate (IMDUR) 30 MG 24 hr tablet Take 30 mg by mouth daily.    [name] losartan (COZAAR) 100 MG tablet Take 100 mg by mouth daily.    . mirabegron ER (MYRBETRIQ) 50 MG TB24 tablet Take 50 mg by mouth daily.    . primidone (MYSOLINE) 50 MG tablet Take 1 tablet by mouth 2 (two) times daily.    . sodium chloride (OCEAN) 0.65 % SOLN nasal spray Place 2 sprays into both nostrils daily.    . tamsulosin (FLOMAX) 0.4 MG CAPS capsule Take 0.4 mg by mouth daily.    [name] zinc sulfate 220 (50 Zn) MG  capsule Take 1 capsule (220 mg total) by mouth daily. 14 capsule 0    ROS: As per HPI. Has no other complaints on emergent assessment. Detailed ROS deferred in the context of acuity of presentation.   Physical Examination: Blood pressure (!) 184/80, pulse 81, temperature 97.7 F (36.5 C), resp. rate 19, height 5\' 11"  (1.803 m), weight 109.8 kg, SpO2 96 %.  HEENT: [place]/AT Lungs: Respirations unlabored Ext: Warm and well perfused  Neurologic Examination: Mental Status: Alert, fully oriented, thought content appropriate.  Speech fluent without evidence of aphasia. Naming intact. Made one error with repetition. Comprehension intact. Able to follow all commands without difficulty. Mild dysarthria.  Cranial Nerves: II:  Visual fields intact with no  extinction to DSS. PERRL.   III,IV, VI: No ptosis. EOMI. No nystagmus.  V,VII: Temp sensation equal bilaterally. Smile symmetric with equal contractions bilaterally. Daughter feels face is slightly different from baseline on the left when at rest. VIII: Hearing intact to conversation IX,X: No hypophonia XI: Symmetric shoulder shrug XII: Midline tongue extension  Motor: Right : Upper extremity   5/5    Left:     Upper extremity   5/5  Lower extremity   5/5     Lower extremity   5/5 Normal tone throughout; no atrophy noted No pronator drift.  Sensory: Temp and light touch intact throughout, bilaterally. No extinction to DSS.  Deep Tendon Reflexes:  2+ bilateral upper and lower extremities.  Cerebellar: No ataxia with FNF bilaterally. However, L > R intention tremor is noted.  Gait: Deferred   Results for orders placed or performed during the hospital encounter of 05/22/20 (from the past 48 hour(s))  CBG monitoring, ED     Status: Abnormal   Collection Time: 05/22/20  9:06 PM  Result Value Ref Range   Glucose-Capillary 187 (H) 70 - 99 mg/dL    Comment: Glucose reference range applies only to samples taken after fasting for at least 8 hours.  Protime-INR     Status: None   Collection Time: 05/22/20  9:07 PM  Result Value Ref Range   Prothrombin Time 12.5 11.4 - 15.2 seconds   INR 1.0 0.8 - 1.2    Comment: (NOTE) INR goal varies based on device and disease states. Performed at [facility], [address], [place], [place]   APTT     Status: None   Collection Time: 05/22/20  9:07 PM  Result Value Ref Range   aPTT 26 24 - 36 seconds    Comment: Performed at [facility], [address] [place], [place]  CBC     Status: Abnormal   Collection Time: 05/22/20  9:07 PM  Result Value Ref Range   WBC 7.0 4.0 - 10.5 K/uL   RBC 4.01 (L) 4.22 - 5.81 MIL/uL   Hemoglobin 12.3 (L) 13.0 - 17.0 g/dL   HCT 05/24/20 (L) 05.3 - 97.6 %   MCV 94.8 80.0 - 100.0 fL    MCH 30.7 26.0 - 34.0 pg   MCHC 32.4 30.0 - 36.0 g/dL   RDW 73.4 19.3 - 79.0 %   Platelets 213 150 - 400 K/uL   nRBC 0.0 0.0 - 0.2 %    Comment: Performed at [facility], [address] [place], [place]  Differential     Status: None   Collection Time: 05/22/20  9:07 PM  Result Value Ref Range   Neutrophils Relative % 65 %  Neutro Abs 4.5 1.7 - 7.7 K/uL   Lymphocytes Relative 24 %   Lymphs Abs 1.6 0.7 - 4.0 K/uL   Monocytes Relative 10 %   Monocytes Absolute 0.7 0.1 - 1.0 K/uL   Eosinophils Relative 1 %   Eosinophils Absolute 0.1 0.0 - 0.5 K/uL   Basophils Relative 0 %   Basophils Absolute 0.0 0.0 - 0.1 K/uL   Immature Granulocytes 0 %   Abs Immature Granulocytes 0.03 0.00 - 0.07 K/uL    Comment: Performed at [facility], [address] [place], [place] [address]  Comprehensive metabolic panel     Status: Abnormal   Collection Time: 05/22/20  9:07 PM  Result Value Ref Range   Sodium 135 135 - 145 mmol/L   Potassium 5.0 3.5 - 5.1 mmol/L    Comment: SPECIMEN HEMOLYZED. HEMOLYSIS MAY AFFECT INTEGRITY OF RESULTS.   Chloride 101 98 - 111 mmol/L   CO2 26 22 - 32 mmol/L   Glucose, Bld 209 (H) 70 - 99 mg/dL    Comment: Glucose reference range applies only to samples taken after fasting for at least 8 hours.   BUN 13 8 - 23 mg/dL   Creatinine, Ser 6.041.33 (H) 0.61 - 1.24 mg/dL   Calcium 9.1 8.9 - 54.010.3 mg/dL   Total Protein 6.3 (L) 6.5 - 8.1 g/dL   Albumin 3.5 3.5 - 5.0 g/dL   AST 21 15 - 41 U/L   ALT 15 0 - 44 U/L   Alkaline Phosphatase 54 38 - 126 U/L   Total Bilirubin 1.1 0.3 - 1.2 mg/dL   GFR, Estimated 55 (L) >60 mL/min    Comment: (NOTE) Calculated using the CKD-EPI Creatinine Equation (2021)    Anion gap 8 5 - 15    Comment: Performed at [facility], [address] [place], [place] [address]  I-stat chem 8, ED     Status: Abnormal   Collection Time: 05/22/20  9:37 PM  Result Value Ref Range   Sodium 139 135 - 145 mmol/L   Potassium 4.8  3.5 - 5.1 mmol/L   Chloride 100 98 - 111 mmol/L   BUN 17 8 - 23 mg/dL   Creatinine, Ser 1.471.10 0.61 - 1.24 mg/dL   Glucose, Bld 829210 (H) 70 - 99 mg/dL    Comment: Glucose reference range applies only to samples taken after fasting for at least 8 hours.   Calcium, Ion 1.22 1.15 - 1.40 mmol/L   TCO2 31 22 - 32 mmol/L   Hemoglobin 12.9 (L) 13.0 - 17.0 g/dL   HCT 56.238.0 (L) 13.039.0 - 86.552.0 %  Resp Panel by RT-PCR (Flu A&B, Covid) Nasopharyngeal Swab     Status: None   Collection Time: 05/22/20 11:58 PM   Specimen: Nasopharyngeal Swab; Nasopharyngeal(NP) swabs in vial transport medium  Result Value Ref Range   SARS Coronavirus 2 by RT PCR NEGATIVE NEGATIVE    Comment: (NOTE) SARS-CoV-2 target nucleic acids are NOT DETECTED.  The SARS-CoV-2 RNA is generally detectable in upper respiratory specimens during the acute phase of infection. The lowest concentration of SARS-CoV-2 viral copies this assay can detect is 138 copies/mL. A negative result does not preclude SARS-Cov-2 infection and should not be used as the sole basis for treatment or other patient management decisions. A negative result may occur with  improper specimen collection/handling, submission of specimen other than nasopharyngeal swab, presence of viral mutation(s) within the areas targeted by this assay, and inadequate number of viral copies(<138  copies/mL). A negative result must be combined with clinical observations, patient history, and epidemiological information. The expected result is Negative.  Fact Sheet for Patients:  BloggerCourse.com  Fact Sheet for Healthcare Providers:  SeriousBroker.it  This test is no t yet approved or cleared by the [place] FDA and  has been authorized for detection and/or diagnosis of SARS-CoV-2 by FDA under an Emergency Use Authorization (EUA). This EUA will remain  in effect (meaning this test can be used) for the duration of  the COVID-19 declaration under Section 564(b)(1) of the Act, 21 U.S.C.section 360bbb-3(b)(1), unless the authorization is terminated  or revoked sooner.       Influenza A by PCR NEGATIVE NEGATIVE   Influenza B by PCR NEGATIVE NEGATIVE    Comment: (NOTE) The Xpert Xpress SARS-CoV-2/FLU/RSV plus assay is intended as an aid in the diagnosis of influenza from Nasopharyngeal swab specimens and should not be used as a sole basis for treatment. Nasal washings and aspirates are unacceptable for Xpert Xpress SARS-CoV-2/FLU/RSV testing.  Fact Sheet for Patients: BloggerCourse.com  Fact Sheet for Healthcare Providers: SeriousBroker.it  This test is not yet approved or cleared by the [place] FDA and has been authorized for detection and/or diagnosis of SARS-CoV-2 by FDA under an Emergency Use Authorization (EUA). This EUA will remain in effect (meaning this test can be used) for the duration of the COVID-19 declaration under Section 564(b)(1) of the Act, 21 U.S.C. section 360bbb-3(b)(1), unless the authorization is terminated or revoked.  Performed at [facility], [address], [place], [place] [address]    CT ANGIO HEAD W OR WO CONTRAST  Result Date: 05/22/2020 CLINICAL DATA:  Left facial droop and headache EXAM: CT ANGIOGRAPHY HEAD AND NECK TECHNIQUE: Multidetector CT imaging of the head and neck was performed using the standard protocol during bolus administration of intravenous contrast. Multiplanar CT image reconstructions and MIPs were obtained to evaluate the vascular anatomy. Carotid stenosis measurements (when applicable) are obtained utilizing NASCET criteria, using the distal internal carotid diameter as the denominator. CONTRAST:  64mL OMNIPAQUE IOHEXOL 350 MG/ML SOLN COMPARISON:  None. FINDINGS: CTA NECK FINDINGS SKELETON: There is no bony spinal canal stenosis. No lytic or blastic lesion. OTHER NECK: Normal pharynx,  larynx and major salivary glands. No cervical lymphadenopathy. Unremarkable thyroid gland. UPPER CHEST: No pneumothorax or pleural effusion. No nodules or masses. AORTIC ARCH: There is calcific atherosclerosis of the aortic arch. There is no aneurysm, dissection or hemodynamically significant stenosis of the visualized portion of the aorta. Conventional 3 vessel aortic branching pattern. The visualized proximal subclavian arteries are widely patent. RIGHT CAROTID SYSTEM: Normal without aneurysm, dissection or stenosis. LEFT CAROTID SYSTEM: Normal without aneurysm, dissection or stenosis. VERTEBRAL ARTERIES: Right dominant configuration. Both origins are clearly patent. There is no dissection, occlusion or flow-limiting stenosis to the skull base (V1-V3 segments). CTA HEAD FINDINGS POSTERIOR CIRCULATION: --Vertebral arteries: Normal V4 segments. --Inferior cerebellar arteries: Normal. --Basilar artery: Normal. --Superior cerebellar arteries: Normal. --Posterior cerebral arteries (PCA): Normal. ANTERIOR CIRCULATION: --Intracranial internal carotid arteries: Normal. --Anterior cerebral arteries (ACA): Normal. Both A1 segments are present. Patent anterior communicating artery (a-comm). --Middle cerebral arteries (MCA): Normal. VENOUS SINUSES: As permitted by contrast timing, patent. ANATOMIC VARIANTS: None Review of the MIP images confirms the above findings. IMPRESSION: Normal CTA of the head and neck. Aortic atherosclerosis (ICD10-I70.0). Electronically Signed   By: Deatra Robinson M.D.   On: 05/22/2020 22:03   CT ANGIO NECK W OR WO CONTRAST  Result Date: 05/22/2020 CLINICAL  DATA:  Left facial droop and headache EXAM: CT ANGIOGRAPHY HEAD AND NECK TECHNIQUE: Multidetector CT imaging of the head and neck was performed using the standard protocol during bolus administration of intravenous contrast. Multiplanar CT image reconstructions and MIPs were obtained to evaluate the vascular anatomy. Carotid stenosis  measurements (when applicable) are obtained utilizing NASCET criteria, using the distal internal carotid diameter as the denominator. CONTRAST:  80mL OMNIPAQUE IOHEXOL 350 MG/ML SOLN COMPARISON:  None. FINDINGS: CTA NECK FINDINGS SKELETON: There is no bony spinal canal stenosis. No lytic or blastic lesion. OTHER NECK: Normal pharynx, larynx and major salivary glands. No cervical lymphadenopathy. Unremarkable thyroid gland. UPPER CHEST: No pneumothorax or pleural effusion. No nodules or masses. AORTIC ARCH: There is calcific atherosclerosis of the aortic arch. There is no aneurysm, dissection or hemodynamically significant stenosis of the visualized portion of the aorta. Conventional 3 vessel aortic branching pattern. The visualized proximal subclavian arteries are widely patent. RIGHT CAROTID SYSTEM: Normal without aneurysm, dissection or stenosis. LEFT CAROTID SYSTEM: Normal without aneurysm, dissection or stenosis. VERTEBRAL ARTERIES: Right dominant configuration. Both origins are clearly patent. There is no dissection, occlusion or flow-limiting stenosis to the skull base (V1-V3 segments). CTA HEAD FINDINGS POSTERIOR CIRCULATION: --Vertebral arteries: Normal V4 segments. --Inferior cerebellar arteries: Normal. --Basilar artery: Normal. --Superior cerebellar arteries: Normal. --Posterior cerebral arteries (PCA): Normal. ANTERIOR CIRCULATION: --Intracranial internal carotid arteries: Normal. --Anterior cerebral arteries (ACA): Normal. Both A1 segments are present. Patent anterior communicating artery (a-comm). --Middle cerebral arteries (MCA): Normal. VENOUS SINUSES: As permitted by contrast timing, patent. ANATOMIC VARIANTS: None Review of the MIP images confirms the above findings. IMPRESSION: Normal CTA of the head and neck. Aortic atherosclerosis (ICD10-I70.0). Electronically Signed   By: Deatra Robinson M.D.   On: 05/22/2020 22:03   MR BRAIN WO CONTRAST  Result Date: 05/22/2020 CLINICAL DATA:  Acute  neurologic deficit. EXAM: MRI HEAD WITHOUT CONTRAST TECHNIQUE: Multiplanar, multiecho pulse sequences of the brain and surrounding structures were obtained without intravenous contrast. COMPARISON:  None. FINDINGS: Brain: No acute infarct, mass effect or extra-axial collection. Chronic microhemorrhage in the right occipital lobe. Hyperintense T2-weighted signal is moderately widespread throughout the white matter. Generalized volume loss without a clear lobar predilection. The midline structures are normal. Vascular: Major flow voids are preserved. Skull and upper cervical spine: Normal calvarium and skull base. Visualized upper cervical spine and soft tissues are normal. Sinuses/Orbits:No paranasal sinus fluid levels or advanced mucosal thickening. No mastoid or middle ear effusion. Normal orbits. IMPRESSION: 1. No acute intracranial abnormality. 2. Generalized volume loss and findings of chronic microvascular ischemia. Electronically Signed   By: Deatra Robinson M.D.   On: 05/22/2020 23:57   CT HEAD CODE STROKE WO CONTRAST  Result Date: 05/22/2020 CLINICAL DATA:  Code stroke.  Left facial droop and dizziness EXAM: CT HEAD WITHOUT CONTRAST TECHNIQUE: Contiguous axial images were obtained from the base of the skull through the vertex without intravenous contrast. COMPARISON:  None. FINDINGS: Brain: There is no mass, hemorrhage or extra-axial collection. The size and configuration of the ventricles and extra-axial CSF spaces are normal. There is hypoattenuation of the periventricular white matter, most commonly indicating chronic ischemic microangiopathy. Vascular: Atherosclerotic calcification of the internal carotid arteries at the skull base. No abnormal hyperdensity of the major intracranial arteries or dural venous sinuses. Skull: The visualized skull base, calvarium and extracranial soft tissues are normal. Sinuses/Orbits: No fluid levels or advanced mucosal thickening of the visualized paranasal sinuses. No  mastoid or middle ear effusion. The orbits are normal.  ASPECTS [facility] Stroke Program Early CT Score) - Ganglionic level infarction (caudate, lentiform nuclei, internal capsule, insula, M1-M3 cortex): 7 - Supraganglionic infarction (M4-M6 cortex): 3 Total score (0-10 with 10 being normal): 10 IMPRESSION: 1. Chronic ischemic microangiopathy without acute intracranial abnormality. 2. ASPECTS is 10. These results were communicated to Dr. Caryl Pina at 9:15 pm on 05/22/2020 by text page via the [facility] messaging system. Electronically Signed   By: Deatra Robinson M.D.   On: 05/22/2020 21:16    Assessment: 80 y.o. male presenting with dysarthria and right facial droop in the context of severe HTN.  1. Exam with dysarthria and equivocal right facial droop.  2. CT head: Chronic ischemic microangiopathy without acute intracranial abnormality. 3. CTA of head and neck: Normal CTA of the head and neck. Aortic atherosclerosis is noted.  4. Overall presentation most consistent with hypertensive urgency. TIA also possible but less likely.   5. Stroke Risk Factors - HTN, DM2, CAD, HLD, sleep apnea  Recommendations: 1. HgbA1c, fasting lipid panel 2. MRI of the brain without contrast 3. PT consult, OT consult, Speech consult 4. Echocardiogram 5. Continue ASA and atorvastatin 6. Risk factor modification 7. Telemetry monitoring 8. Frequent neuro checks 9. BP is significantly elevated. This may be contributing to his sx. Labetalol ordered. Benefits of aggressive management for probable hypertensive urgency outweighs benefit of empiric permissive HTN for possible TIA, which is significantly less likely than hypertensive urgency as the underlying etiology for the patient's presentation.    Addendum: - MRI brain reveals no acute intracranial abnormality. There is generalized volume loss as well as findings of chronic microvascular ischemia. - Stroke has been ruled out. DDx now primarily TIA versus hypertensive  encephalopathy  @Electronically  signed: Dr.  05/23/2020, 2:36 AM

## 2020-05-23 NOTE — H&P (Signed)
History and Physical    Dalbert Stillings WUJ:[id number] DOB: 1940-09-14 DOA: 05/22/2020  PCP: [facility]   Patient coming from: Home  Chief Complaint:  Altered speech and drooping of face.  HPI: Sol Englert is a 80 y.o. male with medical history significant for HTN, DMT2, CAD, BPH, HLD who presents by EMS as code stroke. Pt was at home when family noted that speech was altered and had drooping of right side of mouth which was not normal. He was last seen normal early this am.  Daughter reports that patient is wife died in 04/15/2022 of this year and today was her birthday which made it a very stressful difficult day for him.  He was at church for a visual for his wife.  Last time he was seen normal was in the morning according to daughter.  He denies any chest pain, shortness of breath, fever, cough, syncope, numbness of extremities  ED Course:   Pt had MRI of the brain which showed no acute CVA and CTA of head and neck which showed no large vessel occlusions.  Patient is outside window for TPA administration  Review of Systems:  General: Reports weakness. Denies fever, chills, weight loss, night sweats.  Denies dizziness.  HENT: Denies head trauma, headache, denies change in hearing, tinnitus.  Denies nasal congestion or bleeding.  Denies sore throat, sores in mouth.  Denies difficulty swallowing Eyes: Denies blurry vision, pain in eye, drainage.  Denies discoloration of eyes. Neck: Denies pain.  Denies swelling.  Denies pain with movement. Cardiovascular: Denies chest pain, palpitations.  Denies edema.  Denies orthopnea Respiratory: Denies shortness of breath, cough.  Denies wheezing.  Denies sputum production Gastrointestinal: Denies abdominal pain, swelling.  Denies nausea, vomiting, diarrhea.  Denies melena.  Denies hematemesis. Musculoskeletal: Denies limitation of movement. Denies deformity or swelling.  Denies pain.   Genitourinary: Denies pelvic pain.  Denies urinary  frequency or hesitancy.  Denies dysuria.  Skin: Denies rash.  Denies petechiae, purpura, ecchymosis. Neurological: Reports headache and right facial droop. Reports altered speech earlier which has resolved.  Denies syncope.  Denies seizure activity. Denies paresthesia. Denies visual change. Psychiatric: Denies depression, anxiety.  Denies hallucinations.  Past Medical History:  Diagnosis Date  . Coronary artery disease   . Dyspnea on exertion   . Elevated PSA   . History of stress test    Did not show evidence of ischemia, however, it showed somewhat diminished exercise tolerance and hypertensive response to exercise  . Hx of echocardiogram 09/22/2008   EF >55% showed normal left ventricular systolic functon and grade 1 diastolic dysfunction.  . Hyperlipidemia   . Hypertension   . MI (myocardial infarction) (HCC) 2007   which was treated with medical therapy.   . Sleep apnea   . Type 2 diabetes mellitus (HCC)     History reviewed. No pertinent surgical history.  Social History  reports that he quit smoking about 19 years ago. He has never used smokeless tobacco. He reports that he does not drink alcohol and does not use drugs.  No Known Allergies  History reviewed. No pertinent family history.   Prior to Admission medications   Medication Sig Start Date End Date Taking? Authorizing Provider  acetaminophen (TYLENOL) 325 MG tablet Take 325 mg by mouth 3 (three) times daily as needed for mild pain.    [provider]  aspirin EC 81 MG tablet Take 81 mg by mouth daily.    [provider]  atorvastatin (LIPITOR)  80 MG tablet Take 80 mg by mouth daily. 10/14/13   [provider]  carboxymethylcellul-glycerin (REFRESH OPTIVE) 0.5-0.9 % ophthalmic solution Place 1 drop into both eyes 4 (four) times daily.    [provider]  Cholecalciferol 25 MCG (1000 UT) tablet Take 1,000 Units by mouth daily.    [provider]  dexamethasone (DECADRON) 6  MG tablet Take 1 tablet (6 mg total) by mouth daily. 03/16/20   Glade Lloyd, MD  donepezil (ARICEPT) 10 MG tablet Take 10 mg by mouth at bedtime.    [provider]  finasteride (PROSCAR) 5 MG tablet Take 5 mg by mouth daily.    [provider]  isosorbide mononitrate (IMDUR) 30 MG 24 hr tablet Take 30 mg by mouth daily. 03/11/13   [provider]  losartan (COZAAR) 100 MG tablet Take 100 mg by mouth daily. 11/04/13   [provider]  mirabegron ER (MYRBETRIQ) 50 MG TB24 tablet Take 50 mg by mouth daily.    [provider]  primidone (MYSOLINE) 50 MG tablet Take 1 tablet by mouth 2 (two) times daily. 07/25/15 03/15/20  [provider]  sodium chloride (OCEAN) 0.65 % SOLN nasal spray Place 2 sprays into both nostrils daily.    [provider]  tamsulosin (FLOMAX) 0.4 MG CAPS capsule Take 0.4 mg by mouth daily.    [provider]  zinc sulfate 220 (50 Zn) MG capsule Take 1 capsule (220 mg total) by mouth daily. 03/16/20   Glade Lloyd, MD    Physical Exam: Vitals:   05/22/20 2208 05/22/20 2225 05/22/20 2230 05/23/20 0000  BP: (!) 211/110  (!) 192/88   Pulse: 92 89 88 88  Resp: 18 17 18 15   Temp: 97.7 F (36.5 C)     SpO2: 97% 97% 100% 97%  Weight:      Height:        Constitutional: NAD, calm, comfortable Vitals:   05/22/20 2208 05/22/20 2225 05/22/20 2230 05/23/20 0000  BP: (!) 211/110  (!) 192/88   Pulse: 92 89 88 88  Resp: 18 17 18 15   Temp: 97.7 F (36.5 C)     SpO2: 97% 97% 100% 97%  Weight:      Height:       General: WDWN, Alert and oriented x3.  Eyes: EOMI, PERRL, conjunctivae normal.  Sclera nonicteric HENT:  [place]/AT, external ears normal.  Nares patent without epistasis.  Mucous membranes are moist. Neck: Soft, normal range of motion, supple, no masses, Trachea midline Respiratory: clear to auscultation bilaterally, no wheezing, no crackles. Normal respiratory effort. No accessory muscle use.   Cardiovascular: Regular rate and rhythm, no murmurs / rubs / gallops. No extremity edema. 2+ pedal pulses. No carotid bruits.  Abdomen: Soft, no tenderness, nondistended, no rebound or guarding. No masses palpated. Bowel sounds normoactive Musculoskeletal: FROM. no cyanosis. No joint deformity upper and lower extremities. Normal muscle tone.  Skin: Warm, dry, intact no rashes, lesions, ulcers. No induration Neurologic: CN 2-12 grossly intact.  Normal speech. Sensation intact, patella DTR +1 bilaterally. Strength 5/5 in all extremities.   Psychiatric: Normal judgment and insight.  Normal mood.    Labs on Admission: I have personally reviewed following labs and imaging studies  CBC: Recent Labs  Lab 05/22/20 2107 05/22/20 2137  WBC 7.0  --   NEUTROABS 4.5  --   HGB 12.3* 12.9*  HCT 38.0* 38.0*  MCV 94.8  --   PLT 213  --  Basic Metabolic Panel: Recent Labs  Lab 05/22/20 2107 05/22/20 2137  NA 135 139  K 5.0 4.8  CL 101 100  CO2 26  --   GLUCOSE 209* 210*  BUN 13 17  CREATININE 1.33* 1.10  CALCIUM 9.1  --     GFR: Estimated Creatinine Clearance: 70.9 mL/min (by C-G formula based on SCr of 1.1 mg/dL).  Liver Function Tests: Recent Labs  Lab 05/22/20 2107  AST 21  ALT 15  ALKPHOS 54  BILITOT 1.1  PROT 6.3*  ALBUMIN 3.5    Urine analysis:    Component Value Date/Time   COLORURINE YELLOW 03/14/2020 1719   APPEARANCEUR CLEAR 03/14/2020 1719   LABSPEC 1.023 03/14/2020 1719   PHURINE 5.0 03/14/2020 1719   GLUCOSEU NEGATIVE 03/14/2020 1719   HGBUR SMALL (A) 03/14/2020 1719   BILIRUBINUR NEGATIVE 03/14/2020 1719   KETONESUR 5 (A) 03/14/2020 1719   PROTEINUR 100 (A) 03/14/2020 1719   NITRITE NEGATIVE 03/14/2020 1719   LEUKOCYTESUR NEGATIVE 03/14/2020 1719    Radiological Exams on Admission: CT ANGIO HEAD W OR WO CONTRAST  Result Date: 05/22/2020 CLINICAL DATA:  Left facial droop and headache EXAM: CT ANGIOGRAPHY HEAD AND NECK TECHNIQUE: Multidetector CT  imaging of the head and neck was performed using the standard protocol during bolus administration of intravenous contrast. Multiplanar CT image reconstructions and MIPs were obtained to evaluate the vascular anatomy. Carotid stenosis measurements (when applicable) are obtained utilizing NASCET criteria, using the distal internal carotid diameter as the denominator. CONTRAST:  59mL OMNIPAQUE IOHEXOL 350 MG/ML SOLN COMPARISON:  None. FINDINGS: CTA NECK FINDINGS SKELETON: There is no bony spinal canal stenosis. No lytic or blastic lesion. OTHER NECK: Normal pharynx, larynx and major salivary glands. No cervical lymphadenopathy. Unremarkable thyroid gland. UPPER CHEST: No pneumothorax or pleural effusion. No nodules or masses. AORTIC ARCH: There is calcific atherosclerosis of the aortic arch. There is no aneurysm, dissection or hemodynamically significant stenosis of the visualized portion of the aorta. Conventional 3 vessel aortic branching pattern. The visualized proximal subclavian arteries are widely patent. RIGHT CAROTID SYSTEM: Normal without aneurysm, dissection or stenosis. LEFT CAROTID SYSTEM: Normal without aneurysm, dissection or stenosis. VERTEBRAL ARTERIES: Right dominant configuration. Both origins are clearly patent. There is no dissection, occlusion or flow-limiting stenosis to the skull base (V1-V3 segments). CTA HEAD FINDINGS POSTERIOR CIRCULATION: --Vertebral arteries: Normal V4 segments. --Inferior cerebellar arteries: Normal. --Basilar artery: Normal. --Superior cerebellar arteries: Normal. --Posterior cerebral arteries (PCA): Normal. ANTERIOR CIRCULATION: --Intracranial internal carotid arteries: Normal. --Anterior cerebral arteries (ACA): Normal. Both A1 segments are present. Patent anterior communicating artery (a-comm). --Middle cerebral arteries (MCA): Normal. VENOUS SINUSES: As permitted by contrast timing, patent. ANATOMIC VARIANTS: None Review of the MIP images confirms the above findings.  IMPRESSION: Normal CTA of the head and neck. Aortic atherosclerosis (ICD10-I70.0). Electronically Signed   By: Deatra Robinson M.D.   On: 05/22/2020 22:03   CT ANGIO NECK W OR WO CONTRAST  Result Date: 05/22/2020 CLINICAL DATA:  Left facial droop and headache EXAM: CT ANGIOGRAPHY HEAD AND NECK TECHNIQUE: Multidetector CT imaging of the head and neck was performed using the standard protocol during bolus administration of intravenous contrast. Multiplanar CT image reconstructions and MIPs were obtained to evaluate the vascular anatomy. Carotid stenosis measurements (when applicable) are obtained utilizing NASCET criteria, using the distal internal carotid diameter as the denominator. CONTRAST:  9mL OMNIPAQUE IOHEXOL 350 MG/ML SOLN COMPARISON:  None. FINDINGS: CTA NECK FINDINGS SKELETON: There is no bony spinal canal stenosis. No lytic or  blastic lesion. OTHER NECK: Normal pharynx, larynx and major salivary glands. No cervical lymphadenopathy. Unremarkable thyroid gland. UPPER CHEST: No pneumothorax or pleural effusion. No nodules or masses. AORTIC ARCH: There is calcific atherosclerosis of the aortic arch. There is no aneurysm, dissection or hemodynamically significant stenosis of the visualized portion of the aorta. Conventional 3 vessel aortic branching pattern. The visualized proximal subclavian arteries are widely patent. RIGHT CAROTID SYSTEM: Normal without aneurysm, dissection or stenosis. LEFT CAROTID SYSTEM: Normal without aneurysm, dissection or stenosis. VERTEBRAL ARTERIES: Right dominant configuration. Both origins are clearly patent. There is no dissection, occlusion or flow-limiting stenosis to the skull base (V1-V3 segments). CTA HEAD FINDINGS POSTERIOR CIRCULATION: --Vertebral arteries: Normal V4 segments. --Inferior cerebellar arteries: Normal. --Basilar artery: Normal. --Superior cerebellar arteries: Normal. --Posterior cerebral arteries (PCA): Normal. ANTERIOR CIRCULATION: --Intracranial internal  carotid arteries: Normal. --Anterior cerebral arteries (ACA): Normal. Both A1 segments are present. Patent anterior communicating artery (a-comm). --Middle cerebral arteries (MCA): Normal. VENOUS SINUSES: As permitted by contrast timing, patent. ANATOMIC VARIANTS: None Review of the MIP images confirms the above findings. IMPRESSION: Normal CTA of the head and neck. Aortic atherosclerosis (ICD10-I70.0). Electronically Signed   By: Deatra RobinsonKevin  Herman M.D.   On: 05/22/2020 22:03   MR BRAIN WO CONTRAST  Result Date: 05/22/2020 CLINICAL DATA:  Acute neurologic deficit. EXAM: MRI HEAD WITHOUT CONTRAST TECHNIQUE: Multiplanar, multiecho pulse sequences of the brain and surrounding structures were obtained without intravenous contrast. COMPARISON:  None. FINDINGS: Brain: No acute infarct, mass effect or extra-axial collection. Chronic microhemorrhage in the right occipital lobe. Hyperintense T2-weighted signal is moderately widespread throughout the white matter. Generalized volume loss without a clear lobar predilection. The midline structures are normal. Vascular: Major flow voids are preserved. Skull and upper cervical spine: Normal calvarium and skull base. Visualized upper cervical spine and soft tissues are normal. Sinuses/Orbits:No paranasal sinus fluid levels or advanced mucosal thickening. No mastoid or middle ear effusion. Normal orbits. IMPRESSION: 1. No acute intracranial abnormality. 2. Generalized volume loss and findings of chronic microvascular ischemia. Electronically Signed   By: Deatra RobinsonKevin  Herman M.D.   On: 05/22/2020 23:57   CT HEAD CODE STROKE WO CONTRAST  Result Date: 05/22/2020 CLINICAL DATA:  Code stroke.  Left facial droop and dizziness EXAM: CT HEAD WITHOUT CONTRAST TECHNIQUE: Contiguous axial images were obtained from the base of the skull through the vertex without intravenous contrast. COMPARISON:  None. FINDINGS: Brain: There is no mass, hemorrhage or extra-axial collection. The size and  configuration of the ventricles and extra-axial CSF spaces are normal. There is hypoattenuation of the periventricular white matter, most commonly indicating chronic ischemic microangiopathy. Vascular: Atherosclerotic calcification of the internal carotid arteries at the skull base. No abnormal hyperdensity of the major intracranial arteries or dural venous sinuses. Skull: The visualized skull base, calvarium and extracranial soft tissues are normal. Sinuses/Orbits: No fluid levels or advanced mucosal thickening of the visualized paranasal sinuses. No mastoid or middle ear effusion. The orbits are normal. ASPECTS [facility]([place] Stroke Program Early CT Score) - Ganglionic level infarction (caudate, lentiform nuclei, internal capsule, insula, M1-M3 cortex): 7 - Supraganglionic infarction (M4-M6 cortex): 3 Total score (0-10 with 10 being normal): 10 IMPRESSION: 1. Chronic ischemic microangiopathy without acute intracranial abnormality. 2. ASPECTS is 10. These results were communicated to Dr. Caryl PinaEric Lindzen at 9:15 pm on 05/22/2020 by text page via the [facility] messaging system. Electronically Signed   By: Deatra RobinsonKevin  Herman M.D.   On: 05/22/2020 21:16    EKG: Independently reviewed.  EKG shows normal sinus  rhythm with no acute ST elevation or depression.  QTc 447  Assessment/Plan Principal Problem:   Hypertensive urgency Patient be observed on medical telemetry floor for TIA/CVA. MRI head negative for acute CVA. CTA head and neck negative for large vessel occlusion.  Obtain echocardiogram to evaluate for PFO, wall motion and ejection fraction. Will work to lower BP as MRI is negative for acute CVA. Neurology reported to ER physician that symptoms result of elevated BP most likely and recommended lower. Was given labetolol in ER Antiplatelet therapy with aspirin daily. Continue statin therapy.  Check lipid panel.  Neurochecks per stroke protocol  Active Problems:   Type 2 diabetes mellitus  We will start patient on  heart healthy low-carb diet once passes swallow screening.  Blood sugars were monitored with meals and at bedtime.  Sliding scale insulin provided as needed for glycemic control.  Check hemoglobin A1c    Facial droop Patient has some right-sided facial droop according to daughter.  Is improving.  We will continue to monitor.     Coronary artery disease Chronic.  Continue home medication regimen    DVT prophylaxis: Lovenox for DVT prophylaxis Code Status:   Full code Family Communication:  Diagnosis and plan discussed with patient's daughter at bedside.  Questions answered.  Further recommendations to follow as clinically indicated Disposition Plan:   Patient is from:  Home  Anticipated DC to:  Home  Anticipated DC date:  Anticipate 2 midnight or more stay in the hospital  Anticipated DC barriers: No barriers to discharge identified at this time  Admission status:  Inpatient  Claudean Severance Chotiner MD [facility]  How to contact the [facility] Attending or Consulting provider 7A - 7P or covering provider during after hours 7P -7A, for this patient?   1. Check the care team in [facility] and look for a) attending/consulting [facility] provider listed and b) the [facility] team listed 2. Log into [email or web address] and use [facility] universal password to access. If you do not have the password, please contact the hospital operator. 3. Locate the [facility] provider you are looking for under [facility] and page to a number that you can be directly reached. 4. If you still have difficulty reaching the provider, please page the [facility] (Director on Call) for the Hospitalists listed on amion for assistance.  05/23/2020, 12:19 AM

## 2020-05-23 NOTE — Care Plan (Signed)
This 80 years old African-American male with PMH significant for HTN, DM 2, CAD, BPH, HLD who presents in the ED as stroke code.  Patient was at home when family noticed that his speech was altered and had drooping of right side of his mouth which was not normal.  Daughter reports that patient's wife died in 03/27/2022 of this year and today was her birthday which made it a very stressful difficult day for him.  He was at the church for ritual for his wife. All the work-up in the ED was unremarkable.  MRI of the brain showed no acute CVA and CTA of head and neck showed no large vessel occlusion.  Patient is out of TPA window.  Patient was seen by neurology, stroke was ruled out.  Patient was mainly admitted for stroke work-up.  Echocardiogram is completed report is pending.  PT recommended home with home PT.  Patient blood pressures is still elevated.  Patient also report dizziness after having physical therapy.  Patient was seen and examined in the ED.

## 2020-05-23 NOTE — ED Notes (Signed)
Pt transported to Echo. 

## 2020-05-23 NOTE — Progress Notes (Signed)
STROKE TEAM PROGRESS NOTE   INTERVAL HISTORY  Patient developed right sided facial droop, headache, dizziness and unsteady gait yesterday.  He was last seen normal in the early morning.  He was at church for a visual for his wife who died recently when he felt dizzy.  He had difficulty walking.  He was able to speak but couldn't remember what he was saying.  Symptoms did not improve so he walked to his neighbors house and they called EMS.  On arrival his BP was 211/210.  STAT CT head on arrival, with no acute abnormality noted. NIHSS was 3 withrightfacial droop and dysarthria. Subsequent exam showed resolution of the facial droop. Patientwas nota candidate for tPA due to being out of the time window.  MRI brain revealed no acute intracranial abnormality.    BP elevated, other vitals stable.  Afebrile.  Vitals:   05/23/20 1015 05/23/20 1030 05/23/20 1045 05/23/20 1200  BP:    (!) 155/83  Pulse: 85 85 83 79  Resp: 19 13 11  (!) 22  Temp:      TempSrc:      SpO2: 98% 98% 100% 95%  Weight:      Height:       CBC:  Recent Labs  Lab 05/22/20 2107 05/22/20 2137 05/23/20 0504  WBC 7.0  --  6.8  NEUTROABS 4.5  --   --   HGB 12.3* 12.9* 11.8*  HCT 38.0* 38.0* 36.9*  MCV 94.8  --  94.9  PLT 213  --  217   Basic Metabolic Panel:  Recent Labs  Lab 05/22/20 2107 05/22/20 2137 05/23/20 0504  NA 135 139 136  K 5.0 4.8 3.9  CL 101 100 101  CO2 26  --  24  GLUCOSE 209* 210* 114*  BUN 13 17 10   CREATININE 1.33* 1.10 1.14  CALCIUM 9.1  --  9.4   Lipid Panel:  Recent Labs  Lab 05/23/20 0504  CHOL 106  TRIG 51  HDL 32*  CHOLHDL 3.3  VLDL 10  LDLCALC 64   HgbA1c:  Recent Labs  Lab 05/23/20 0504  HGBA1C 6.7*   Urine Drug Screen: No results for input(s): LABOPIA, COCAINSCRNUR, LABBENZ, AMPHETMU, THCU, LABBARB in the last 168 hours.  Alcohol Level No results for input(s): ETH in the last 168 hours.  IMAGING past 24 hours  CT ANGIO NECK W OR WO CONTRAST  Result Date:  05/22/2020 IMPRESSION: Normal CTA of the head and neck. Aortic atherosclerosis    MR BRAIN WO CONTRAST  Result Date: 05/22/2020 IMPRESSION:  1. No acute intracranial abnormality.  2. Generalized volume loss and findings of chronic microvascular ischemia.   CT HEAD CODE STROKE WO CONTRAST  Result Date: 05/22/2020 IMPRESSION: 1. Chronic ischemic microangiopathy without acute intracranial abnormality. 2. ASPECTS is 10.   PHYSICAL EXAM HEENT: [place]/AT Lungs: Respirations unlabored Ext: Warm and well perfused  Neurologic Examination: Mental Status: Alert, fully oriented.  Speech fluent without evidence of aphasia. Comprehension intact. Able to follow all commands without difficulty. Mild dysarthria.  Cranial Nerves: II:  Visual fields intact with no extinction to DSS. PERRL.   III,IV, VI: No ptosis. EOMI. No nystagmus.  V,VII: Temp sensation equal bilaterally. Smile symmetric with equal contractions bilaterally.  VIII: Hearing intact to conversation IX,X: No hypophonia XI: Symmetric shoulder shrug XII: Midline tongue extension  Motor: Right :  Upper extremity   5/5  Left:     Upper extremity   5/5             Lower extremity   5/5                                                  Lower extremity   5/5 Normal tone throughout; no atrophy noted No pronator drift.  Sensory: Temp and light touch intact throughout, bilaterally. No extinction to DSS.   Cerebellar: No ataxia with FNF bilaterally. However, L > R intention tremor is noted.  Gait: Deferred   ASSESSMENT/PLAN Mr. Usama Harkless is a 80 y.o. male with history of HTN, DM2, CAD, HLD, sleep apnea presenting with right sided facial droop, headache, dizziness and unsteady gait.  STAT CT head on arrival, with no acute abnormality noted. NIHSS was 3 withrightfacial droop and dysarthria. Subsequent exam showed resolution of the facial droop. Patientis nota candidate for tPA due to being out of the time  window.  CTA of head and neck which showed no large vessel occlusions.   TIA versus hypertensive encephalopathy  Code Stroke CT head: Chronic ischemic microangiopathy without acute intracranial abnormality. ASPECTS 10   CTA head & neck: unremarkable  MRI  Brain: no acute intracranial abnormality.  2D Echo pending  LDL 64  HgbA1c 6.7  VTE prophylaxis - Lovenox  Diet: heart healthy  No antithrombotic prior to admission, now on aspirin 81 mg daily and clopidogrel 75 mg daily. DAPT with aspirin 81mg  and plavix for 3 weeks then aspirin alone  Therapy recommendations:  PT/OT pending  Disposition:  pending  Hypertensive Urgency  Home meds: Imdur 30mg  daily, cozaar 100 mg daily  Unstable on admission 211/110, current SBP range 115-175  Resume home medications and add hydralazine and labetalol prn . BP goal normotensive in the next 3-5 days  Hyperlipidemia  Home meds:  Lipitor 80mg , resumed in hospital  LDL 64, goal < 70  Continue statin at discharge  Diabetes type II Controlled  Home meds:  Metformin 100mg  bid, glipizide 10mg  daily  HgbA1c 6.7, goal < 7.0  CBGs Recent Labs    05/22/20 2106 05/23/20 0753 05/23/20 1244  GLUCAP 187* 109* 150*      SSI  Other Stroke Risk Factors  Advanced Age >/= 7   Obesity, Body mass index is 33.76 kg/m., BMI >/= 30 associated with increased stroke risk, recommend weight loss, diet and exercise as appropriate   Remote smoker, quit 19 years ago  Coronary artery disease: MI (2007)  Migraines  Obstructive sleep apnea, encouraged to use CPAP at home  Congestive heart failure   Hospital day # 1 I have personally obtained history,examined this patient, reviewed notes, independently viewed imaging studies, participated in medical decision making and plan of care.ROS completed by me personally and pertinent positives fully documented  I have made any additions or clarifications directly to the above note. Agree with note  above.  He presented with sudden onset of dizziness, headache and facial droop in the setting of significantly elevated blood pressure following a very stressful situation.  MRI scan is negative for acute stroke and CT angiogram showed no significant large vessel stenosis or occlusion.  Await echo results.  Recommend aspirin Plavix for 3 weeks followed by Plavix alone.  From discussion with patient and with Dr. 2107 and answered questions.  Greater than  50% time during the 35-minute visit was spent in counseling and coordination of care about possible TIA versus hypertensive urgency and answering questions.   Delia Heady, MD Medical Director [facility] Pager: [phone] 05/23/2020 4:49 PM   To contact Stroke Continuity provider, please refer to [email or web address]. After hours, contact General Neurology

## 2020-05-23 NOTE — Progress Notes (Signed)
Pt seen and evaluated in ED. Pt overall at a supervision level for mobility tasks. No overt LOB noted. Per RN, pt's son concerned about not taking meds and interested in someone coming out to check on him. Recommending HHRN and HHPT for safety eval at home. CM notified. Formal note to follow.   Farley Ly, PT, DPT  Acute Rehabilitation Services  Pager: [phone] Office: [phone]

## 2020-05-23 NOTE — ED Notes (Signed)
Pt son Dresden Lozito updated on plan of care and pt status  He would like to pt updated with any changes and disposition, he is the power of attorney for his father at this time

## 2020-05-24 ENCOUNTER — Other Ambulatory Visit: Payer: Self-pay

## 2020-05-24 DIAGNOSIS — I160 Hypertensive urgency: Secondary | ICD-10-CM | POA: Diagnosis not present

## 2020-05-24 LAB — GLUCOSE, CAPILLARY: Glucose-Capillary: 129 mg/dL — ABNORMAL HIGH (ref 70–99)

## 2020-05-24 MED ORDER — ASPIRIN 81 MG PO TBEC: 81.0000 mg | DELAYED_RELEASE_TABLET | Freq: Every day | ORAL | 0 refills | Status: AC

## 2020-05-24 MED ORDER — CLOPIDOGREL BISULFATE 75 MG PO TABS: 75.0000 mg | ORAL_TABLET | Freq: Every day | ORAL | 11 refills | Status: AC

## 2020-05-24 NOTE — Progress Notes (Signed)
Occupational Therapy Evaluation Patient Details Name: Dominic Goodwin MRN: [id number] DOB: 01-05-1941 Today's Date: 05/24/2020    History of Present Illness Pt is a 80 y/o male admitted 3/13 secondary to speech abnormalities and drooping of right side of mouth. MRI negative. Thought to be secondary to hypertensive emergency. PMH includes DM, CAD, HTN, and MI.   Clinical Impression   Pt at baseline regarding his basic ADL tasks, however pt requires assistance with IADL tasks. Pt tearful during session and states "I don't know how to do anything because my wife did everything". Pt states he didn't pay his mortgage for 2 months because he "doesn't know how". States he doesn't feel like going shopping or doing anything. Strongly recommend outpt grief counseling - pt agreeable. Also recommend HHOT to teach/address IADL skills and coping to maximize functional level of indepedence and reduce risk of readmission. CM notified of recommendations.     Follow Up Recommendations  Home health OT;Supervision - Intermittent    Equipment Recommendations  None recommended by OT    Recommendations for Other Services Other (comment) (outpatient grief counseling)     Precautions / Restrictions Precautions Precautions: None      Mobility Bed Mobility Overal bed mobility: Modified Independent                  Transfers Overall transfer level: Modified independent                    Balance Overall balance assessment: No apparent balance deficits (not formally assessed)                                         ADL either performed or assessed with clinical judgement   ADL Overall ADL's : At baseline                                             Vision         Perception     Praxis      Pertinent Vitals/Pain Pain Assessment: No/denies pain     Hand Dominance     Extremity/Trunk Assessment Upper Extremity Assessment Upper Extremity  Assessment: Overall WFL for tasks assessed   Lower Extremity Assessment Lower Extremity Assessment: Defer to PT evaluation   Cervical / Trunk Assessment Cervical / Trunk Assessment: Normal   Communication Communication Communication: HOH   Cognition Arousal/Alertness: Awake/alert Behavior During Therapy: WFL for tasks assessed/performed Overall Cognitive Status: No family/caregiver present to determine baseline cognitive functioning                                 General Comments: Unsure of pt's baseline. Uses humor to cover some deficits. (Pt has difficulty with attention; states he didn't pay his mortgage for 2 months becuase he "forgot about it" and this is something his wife took care of)   General Comments       Exercises     Shoulder Instructions      Home Living Family/patient expects to be discharged to:: Private residence Living Arrangements: Alone Available Help at Discharge: Family Type of Home: House Home Access: Stairs to enter [employer] of Steps: 1 Entrance Stairs-Rails: None Home Layout: One level  Bathroom Shower/Tub: Tub/shower unit;Walk-in shower   Bathroom Toilet: Standard     Home Equipment: Cane - single point;Walker - 2 wheels;Wheelchair - manual;Crutches;Bedside commode;Shower seat   Additional Comments: Pt's wife recently died. Per RN, pt's son concerned about pt taking his med and interested in [facility] services.      Prior Functioning/Environment Level of Independence: Independent                 OT Problem List: Decreased cognition      OT Treatment/Interventions:      OT Goals(Current goals can be found in the care plan section) Acute Rehab OT Goals Patient Stated Goal: to go home and deal with his wife's death OT Goal Formulation: All assessment and education complete, DC therapy (Continue with HHOT)  OT Frequency:     Barriers to D/C:            Co-evaluation              AM-PAC OT "6  Clicks" Daily Activity     Outcome Measure Help from another person eating meals?: None Help from another person taking care of personal grooming?: None Help from another person toileting, which includes using toliet, bedpan, or urinal?: None Help from another person bathing (including washing, rinsing, drying)?: None Help from another person to put on and taking off regular upper body clothing?: None Help from another person to put on and taking off regular lower body clothing?: None 6 Click Score: 24   End of Session Nurse Communication: Other (comment) (DC needs)  Activity Tolerance: Patient tolerated treatment well Patient left: in bed;Other (comment) (sitting EOB)  OT Visit Diagnosis: Other symptoms and signs involving cognitive function                Time: 1000-1022 OT Time Calculation (min): 22 min Charges:  OT General Charges $OT Visit: 1 Visit OT Evaluation $OT Eval Low Complexity: 1 Low  [name], OT/L   Acute OT Clinical Specialist Acute Rehabilitation Services Pager [phone] Office [phone]   [facility] 05/24/2020, 10:36 AM

## 2020-05-24 NOTE — Progress Notes (Signed)
Discharge teaching complete. Meds, diet,activity, follow up appointments reviewed and all questions answered. Copy of instructions given to patient and prescription sent to pharmacy.  

## 2020-05-24 NOTE — Care Management (Signed)
Called [facility] notification line. They have already been notified and called PCP at [facility].    Dominic Goodwin

## 2020-05-24 NOTE — Discharge Summary (Signed)
Physician Discharge Summary  Dominic Goodwin ZOX:[id number] DOB: [birth year] DOA: 05/22/2020  PCP: [facility]  Admit date: 05/22/2020   Discharge date: 05/24/2020  Admitted From: Home.  Disposition:   Home Health services ( Home PT/OT )  Recommendations for Outpatient Follow-up:  1. Follow up with PCP in 1-2 weeks. 2. Please obtain BMP/CBC in one week. 3. Advised to take Aspirin and plavix for 3 weeks followed by plavix only. 4. Advised to follow up Neurology in 4-6 weeks.  Home Health: None. Equipment/Devices: None.  Discharge Condition: Stable CODE STATUS:Full code Diet recommendation: Heart Healthy   Brief [facility] course: This 80 years old African-American male with PMH significant for HTN, DM 2, CAD, BPH, HLD who presents in the ED as stroke code.  Patient was at home when family noticed that his speech was altered and had drooping of right side of his mouth which was not normal.  Daughter reports that patient's wife died in 05-May-2022 of this year and today was her birthday which made it a very stressful and difficult day for him.  He was at the church for ritual for his wife. He has unsteadiness.  He was found to have significantly elevated blood pressure. All the work-up in the ED was unremarkable.  MRI of the brain showed no acute CVA and CTA of head and neck showed no large vessel occlusion.  Patient is out of TPA window.  Patient was seen by Neurology, stroke was ruled out.  Patient was mainly admitted for stroke work-up.  Echocardiogram ; LVEF 55 to 60%,  no regional wall motion abnormalities.  No embolic source. PT recommended home with home PT/ OT.  Patient blood pressures improved.    Patient was cleared from neurology to be discharged on dual antiplatelet therapy(aspirin and Plavix for 3 weeks) followed by Plavix only therapy.  Patient is being discharged home and will follow up with neurology in 4 to 6 weeks.  Primary care physician has to arrange appointment  with neurologist.   Discharge Diagnoses:  Principal Problem:   Hypertensive urgency Active Problems:   Type 2 diabetes mellitus (HCC)   Coronary artery disease   Facial droop   TIA (transient ischemic attack)    Discharge Instructions  Discharge Instructions    Call MD for:  difficulty breathing, headache or visual disturbances   Complete by: As directed    Call MD for:  persistant dizziness or light-headedness   Complete by: As directed    Call MD for:  persistant nausea and vomiting   Complete by: As directed    Diet - low sodium heart healthy   Complete by: As directed    Diet Carb Modified   Complete by: As directed    Discharge instructions   Complete by: As directed    Advised to follow up PCP in one week. Advised to take Aspirin and plavix for 3 weeks followed by plavix only. Advise dto follow up neurology in 4 weeks.   Increase activity slowly   Complete by: As directed      Allergies as of 05/24/2020   No Known Allergies     Medication List    STOP taking these medications   dexamethasone 6 MG tablet Commonly known as: DECADRON   zinc sulfate 220 (50 Zn) MG capsule     TAKE these medications   acetaminophen 325 MG tablet Commonly known as: TYLENOL Take 325 mg by mouth 3 (three) times daily as needed for mild pain.  aspirin 81 MG EC tablet Take 1 tablet (81 mg total) by mouth daily. Swallow whole. Start taking on: May 25, 2020 What changed: additional instructions   atorvastatin 80 MG tablet Commonly known as: LIPITOR Take 80 mg by mouth daily.   carboxymethylcellul-glycerin 0.5-0.9 % ophthalmic solution Commonly known as: REFRESH OPTIVE Place 1 drop into both eyes 3 (three) times daily as needed for dry eyes.   clopidogrel 75 MG tablet Commonly known as: Plavix Take 1 tablet (75 mg total) by mouth daily.   donepezil 10 MG tablet Commonly known as: ARICEPT Take 10 mg by mouth at bedtime.   finasteride 5 MG tablet Commonly known as:  PROSCAR Take 5 mg by mouth daily.   glipiZIDE 10 MG tablet Commonly known as: GLUCOTROL Take 10 mg by mouth daily before breakfast.   isosorbide mononitrate 30 MG 24 hr tablet Commonly known as: IMDUR Take 30 mg by mouth daily.   losartan 100 MG tablet Commonly known as: COZAAR Take 100 mg by mouth daily.   metFORMIN 1000 MG tablet Commonly known as: GLUCOPHAGE Take 1,000 mg by mouth 2 (two) times daily with a meal.   metoprolol tartrate 50 MG tablet Commonly known as: LOPRESSOR Take 50 mg by mouth 2 (two) times daily.   mirabegron ER 50 MG Tb24 tablet Commonly known as: MYRBETRIQ Take 50 mg by mouth daily.   primidone 50 MG tablet Commonly known as: MYSOLINE Take 1 tablet by mouth 2 (two) times daily.   sodium chloride 0.65 % Soln nasal spray Commonly known as: OCEAN Place 2 sprays into both nostrils daily.   tamsulosin 0.4 MG Caps capsule Commonly known as: FLOMAX Take 0.4 mg by mouth daily.   Vitamin D3 25 MCG tablet Commonly known as: Vitamin D Take 1,000 Units by mouth daily.       Follow-up Information    [facility] up in 1 week(s).   Contact information: [address] [facility] [place] [address] [phone]              No Known Allergies  Consultations:  Neurology   Procedures/Studies: CT ANGIO HEAD W OR WO CONTRAST  Result Date: 05/22/2020 CLINICAL DATA:  Left facial droop and headache EXAM: CT ANGIOGRAPHY HEAD AND NECK TECHNIQUE: Multidetector CT imaging of the head and neck was performed using the standard protocol during bolus administration of intravenous contrast. Multiplanar CT image reconstructions and MIPs were obtained to evaluate the vascular anatomy. Carotid stenosis measurements (when applicable) are obtained utilizing NASCET criteria, using the distal internal carotid diameter as the denominator. CONTRAST:  80mL OMNIPAQUE IOHEXOL 350 MG/ML SOLN COMPARISON:  None. FINDINGS: CTA NECK FINDINGS  SKELETON: There is no bony spinal canal stenosis. No lytic or blastic lesion. OTHER NECK: Normal pharynx, larynx and major salivary glands. No cervical lymphadenopathy. Unremarkable thyroid gland. UPPER CHEST: No pneumothorax or pleural effusion. No nodules or masses. AORTIC ARCH: There is calcific atherosclerosis of the aortic arch. There is no aneurysm, dissection or hemodynamically significant stenosis of the visualized portion of the aorta. Conventional 3 vessel aortic branching pattern. The visualized proximal subclavian arteries are widely patent. RIGHT CAROTID SYSTEM: Normal without aneurysm, dissection or stenosis. LEFT CAROTID SYSTEM: Normal without aneurysm, dissection or stenosis. VERTEBRAL ARTERIES: Right dominant configuration. Both origins are clearly patent. There is no dissection, occlusion or flow-limiting stenosis to the skull base (V1-V3 segments). CTA HEAD FINDINGS POSTERIOR CIRCULATION: --Vertebral arteries: Normal V4 segments. --Inferior cerebellar arteries: Normal. --Basilar artery: Normal. --Superior cerebellar arteries: Normal. --Posterior  cerebral arteries (PCA): Normal. ANTERIOR CIRCULATION: --Intracranial internal carotid arteries: Normal. --Anterior cerebral arteries (ACA): Normal. Both A1 segments are present. Patent anterior communicating artery (a-comm). --Middle cerebral arteries (MCA): Normal. VENOUS SINUSES: As permitted by contrast timing, patent. ANATOMIC VARIANTS: None Review of the MIP images confirms the above findings. IMPRESSION: Normal CTA of the head and neck. Aortic atherosclerosis (ICD10-I70.0). Electronically Signed   By: Deatra Robinson M.D.   On: 05/22/2020 22:03   CT ANGIO NECK W OR WO CONTRAST  Result Date: 05/22/2020 CLINICAL DATA:  Left facial droop and headache EXAM: CT ANGIOGRAPHY HEAD AND NECK TECHNIQUE: Multidetector CT imaging of the head and neck was performed using the standard protocol during bolus administration of intravenous contrast. Multiplanar CT  image reconstructions and MIPs were obtained to evaluate the vascular anatomy. Carotid stenosis measurements (when applicable) are obtained utilizing NASCET criteria, using the distal internal carotid diameter as the denominator. CONTRAST:  80mL OMNIPAQUE IOHEXOL 350 MG/ML SOLN COMPARISON:  None. FINDINGS: CTA NECK FINDINGS SKELETON: There is no bony spinal canal stenosis. No lytic or blastic lesion. OTHER NECK: Normal pharynx, larynx and major salivary glands. No cervical lymphadenopathy. Unremarkable thyroid gland. UPPER CHEST: No pneumothorax or pleural effusion. No nodules or masses. AORTIC ARCH: There is calcific atherosclerosis of the aortic arch. There is no aneurysm, dissection or hemodynamically significant stenosis of the visualized portion of the aorta. Conventional 3 vessel aortic branching pattern. The visualized proximal subclavian arteries are widely patent. RIGHT CAROTID SYSTEM: Normal without aneurysm, dissection or stenosis. LEFT CAROTID SYSTEM: Normal without aneurysm, dissection or stenosis. VERTEBRAL ARTERIES: Right dominant configuration. Both origins are clearly patent. There is no dissection, occlusion or flow-limiting stenosis to the skull base (V1-V3 segments). CTA HEAD FINDINGS POSTERIOR CIRCULATION: --Vertebral arteries: Normal V4 segments. --Inferior cerebellar arteries: Normal. --Basilar artery: Normal. --Superior cerebellar arteries: Normal. --Posterior cerebral arteries (PCA): Normal. ANTERIOR CIRCULATION: --Intracranial internal carotid arteries: Normal. --Anterior cerebral arteries (ACA): Normal. Both A1 segments are present. Patent anterior communicating artery (a-comm). --Middle cerebral arteries (MCA): Normal. VENOUS SINUSES: As permitted by contrast timing, patent. ANATOMIC VARIANTS: None Review of the MIP images confirms the above findings. IMPRESSION: Normal CTA of the head and neck. Aortic atherosclerosis (ICD10-I70.0). Electronically Signed   By: Deatra Robinson M.D.   On:  05/22/2020 22:03   MR BRAIN WO CONTRAST  Result Date: 05/22/2020 CLINICAL DATA:  Acute neurologic deficit. EXAM: MRI HEAD WITHOUT CONTRAST TECHNIQUE: Multiplanar, multiecho pulse sequences of the brain and surrounding structures were obtained without intravenous contrast. COMPARISON:  None. FINDINGS: Brain: No acute infarct, mass effect or extra-axial collection. Chronic microhemorrhage in the right occipital lobe. Hyperintense T2-weighted signal is moderately widespread throughout the white matter. Generalized volume loss without a clear lobar predilection. The midline structures are normal. Vascular: Major flow voids are preserved. Skull and upper cervical spine: Normal calvarium and skull base. Visualized upper cervical spine and soft tissues are normal. Sinuses/Orbits:No paranasal sinus fluid levels or advanced mucosal thickening. No mastoid or middle ear effusion. Normal orbits. IMPRESSION: 1. No acute intracranial abnormality. 2. Generalized volume loss and findings of chronic microvascular ischemia. Electronically Signed   By: Deatra Robinson M.D.   On: 05/22/2020 23:57   ECHOCARDIOGRAM COMPLETE  Result Date: 05/23/2020    ECHOCARDIOGRAM REPORT   Patient Name:   Dominic Goodwin Date of Exam: 05/23/2020 Medical Rec #:  [id number]      Height:       71.0 in Accession #:    [id number]     Weight:  242.1 lb Date of Birth:  02/17/1941       BSA:          2.287 m Patient Age:    79 years       BP:           155/83 mmHg Patient Gender: M              HR:           84 bpm. Exam Location:  Inpatient Procedure: 2D Echo Indications:    TIA G45.9  History:        Patient has prior history of Echocardiogram examinations, most                 recent 12/03/2013. Previous Myocardial Infarction and CAD; Risk                 Factors:Hypertension and Dyslipidemia.  Sonographer:    Thurman Coyerasey Kirkpatrick RDCS (AE) Referring Phys: [id number] Claudean SeveranceBRADLEY S CHOTINER IMPRESSIONS  1. Left ventricular ejection fraction, by estimation, is 55  to 60%. The left ventricle has normal function. The left ventricle has no regional wall motion abnormalities. Left ventricular diastolic parameters are consistent with Grade I diastolic dysfunction (impaired relaxation).  2. Right ventricular systolic function is normal. The right ventricular size is normal.  3. The mitral valve is grossly normal. No evidence of mitral valve regurgitation.  4. The aortic valve is grossly normal. Aortic valve regurgitation is not visualized. No aortic stenosis is present. FINDINGS  Left Ventricle: Left ventricular ejection fraction, by estimation, is 55 to 60%. The left ventricle has normal function. The left ventricle has no regional wall motion abnormalities. The left ventricular internal cavity size was normal in size. There is  no left ventricular hypertrophy. Left ventricular diastolic parameters are consistent with Grade I diastolic dysfunction (impaired relaxation). Right Ventricle: The right ventricular size is normal. No increase in right ventricular wall thickness. Right ventricular systolic function is normal. Left Atrium: Left atrial size was normal in size. Right Atrium: Right atrial size was normal in size. Pericardium: There is no evidence of pericardial effusion. Mitral Valve: The mitral valve is grossly normal. No evidence of mitral valve regurgitation. Tricuspid Valve: The tricuspid valve is grossly normal. Tricuspid valve regurgitation is not demonstrated. Aortic Valve: The aortic valve is grossly normal. Aortic valve regurgitation is not visualized. No aortic stenosis is present. Pulmonic Valve: The pulmonic valve was not well visualized. Pulmonic valve regurgitation is not visualized. Aorta: The aortic root and ascending aorta are structurally normal, with no evidence of dilitation. IAS/Shunts: The atrial septum is grossly normal.  LEFT VENTRICLE PLAX 2D LVIDd:         4.80 cm  Diastology LVIDs:         3.80 cm  LV e' medial:    7.29 cm/s LV PW:         1.00 cm   LV E/e' medial:  7.6 LV IVS:        1.00 cm  LV e' lateral:   7.94 cm/s LVOT diam:     2.20 cm  LV E/e' lateral: 7.0 LV SV:         80 LV SV Index:   35 LVOT Area:     3.80 cm  RIGHT VENTRICLE RV S prime:     18.80 cm/s TAPSE (M-mode): 2.4 cm LEFT ATRIUM             Index       RIGHT ATRIUM  Index LA diam:        2.70 cm 1.18 cm/m  RA Area:     20.50 cm LA Vol (A2C):   33.1 ml 14.47 ml/m RA Volume:   52.20 ml  22.83 ml/m LA Vol (A4C):   37.4 ml 16.36 ml/m LA Biplane Vol: 37.3 ml 16.31 ml/m  AORTIC VALVE LVOT Vmax:   117.00 cm/s LVOT Vmean:  84.300 cm/s LVOT VTI:    0.210 m  AORTA Ao Root diam: 2.80 cm MITRAL VALVE MV Area (PHT): 2.91 cm    SHUNTS MV Decel Time: 261 msec    Systemic VTI:  0.21 m MV E velocity: 55.70 cm/s  Systemic Diam: 2.20 cm MV A velocity: 80.60 cm/s MV E/A ratio:  0.69 Kristeen Miss MD Electronically signed by Kristeen Miss MD Signature Date/Time: 05/23/2020/4:52:50 PM    Final    CT HEAD CODE STROKE WO CONTRAST  Result Date: 05/22/2020 CLINICAL DATA:  Code stroke.  Left facial droop and dizziness EXAM: CT HEAD WITHOUT CONTRAST TECHNIQUE: Contiguous axial images were obtained from the base of the skull through the vertex without intravenous contrast. COMPARISON:  None. FINDINGS: Brain: There is no mass, hemorrhage or extra-axial collection. The size and configuration of the ventricles and extra-axial CSF spaces are normal. There is hypoattenuation of the periventricular white matter, most commonly indicating chronic ischemic microangiopathy. Vascular: Atherosclerotic calcification of the internal carotid arteries at the skull base. No abnormal hyperdensity of the major intracranial arteries or dural venous sinuses. Skull: The visualized skull base, calvarium and extracranial soft tissues are normal. Sinuses/Orbits: No fluid levels or advanced mucosal thickening of the visualized paranasal sinuses. No mastoid or middle ear effusion. The orbits are normal. ASPECTS [facility]  Stroke Program Early CT Score) - Ganglionic level infarction (caudate, lentiform nuclei, internal capsule, insula, M1-M3 cortex): 7 - Supraganglionic infarction (M4-M6 cortex): 3 Total score (0-10 with 10 being normal): 10 IMPRESSION: 1. Chronic ischemic microangiopathy without acute intracranial abnormality. 2. ASPECTS is 10. These results were communicated to Dr. Caryl Pina at 9:15 pm on 05/22/2020 by text page via the [facility] messaging system. Electronically Signed   By: Deatra Robinson M.D.   On: 05/22/2020 21:16    Echocardiogram, CT head, CTA head and neck, MRI   Subjective: Patient was seen and examined at bedside.  Overnight events noted.  Patient seems much improved.   Patient reports resolution of symptoms.  Patient has ambulated well without any symptoms.   Patient wants to be discharged.  Home health services been arranged.  Discharge Exam: Vitals:   05/24/20 0349 05/24/20 0800  BP: (!) 151/101 (!) 187/99  Pulse: 82 79  Resp: 16 16  Temp: 99.2 F (37.3 C) 97.9 F (36.6 C)  SpO2: 97% 98%   Vitals:   05/23/20 1948 05/24/20 0001 05/24/20 0349 05/24/20 0800  BP: (!) 160/106 (!) 143/77 (!) 151/101 (!) 187/99  Pulse: 81 91 82 79  Resp: 17 19 16 16   Temp: 98.5 F (36.9 C) 98.6 F (37 C) 99.2 F (37.3 C) 97.9 F (36.6 C)  TempSrc: Oral Oral Oral Oral  SpO2: 97% 95% 97% 98%  Weight:      Height:        General: Pt is alert, awake, not in acute distress Cardiovascular: RRR, S1/S2 +, no rubs, no gallops Respiratory: CTA bilaterally, no wheezing, no rhonchi Abdominal: Soft, NT, ND, bowel sounds + Extremities: no edema, no cyanosis    The results of significant diagnostics from this hospitalization (including imaging,  microbiology, ancillary and laboratory) are listed below for reference.     Microbiology: Recent Results (from the past 240 hour(s))  Resp Panel by RT-PCR (Flu A&B, Covid) Nasopharyngeal Swab     Status: None   Collection Time: 05/22/20 11:58 PM    Specimen: Nasopharyngeal Swab; Nasopharyngeal(NP) swabs in vial transport medium  Result Value Ref Range Status   SARS Coronavirus 2 by RT PCR NEGATIVE NEGATIVE Final    Comment: (NOTE) SARS-CoV-2 target nucleic acids are NOT DETECTED.  The SARS-CoV-2 RNA is generally detectable in upper respiratory specimens during the acute phase of infection. The lowest concentration of SARS-CoV-2 viral copies this assay can detect is 138 copies/mL. A negative result does not preclude SARS-Cov-2 infection and should not be used as the sole basis for treatment or other patient management decisions. A negative result may occur with  improper specimen collection/handling, submission of specimen other than nasopharyngeal swab, presence of viral mutation(s) within the areas targeted by this assay, and inadequate number of viral copies(<138 copies/mL). A negative result must be combined with clinical observations, patient history, and epidemiological information. The expected result is Negative.  Fact Sheet for Patients:  BloggerCourse.com  Fact Sheet for Healthcare Providers:  SeriousBroker.it  This test is no t yet approved or cleared by the [place] FDA and  has been authorized for detection and/or diagnosis of SARS-CoV-2 by FDA under an Emergency Use Authorization (EUA). This EUA will remain  in effect (meaning this test can be used) for the duration of the COVID-19 declaration under Section 564(b)(1) of the Act, 21 U.S.C.section 360bbb-3(b)(1), unless the authorization is terminated  or revoked sooner.       Influenza A by PCR NEGATIVE NEGATIVE Final   Influenza B by PCR NEGATIVE NEGATIVE Final    Comment: (NOTE) The Xpert Xpress SARS-CoV-2/FLU/RSV plus assay is intended as an aid in the diagnosis of influenza from Nasopharyngeal swab specimens and should not be used as a sole basis for treatment. Nasal washings and aspirates are  unacceptable for Xpert Xpress SARS-CoV-2/FLU/RSV testing.  Fact Sheet for Patients: BloggerCourse.com  Fact Sheet for Healthcare Providers: SeriousBroker.it  This test is not yet approved or cleared by the [place] FDA and has been authorized for detection and/or diagnosis of SARS-CoV-2 by FDA under an Emergency Use Authorization (EUA). This EUA will remain in effect (meaning this test can be used) for the duration of the COVID-19 declaration under Section 564(b)(1) of the Act, 21 U.S.C. section 360bbb-3(b)(1), unless the authorization is terminated or revoked.  Performed at [facility], [address] [place], [place] [address]      Labs: BNP (last 3 results) No results for input(s): BNP in the last 8760 hours. Basic Metabolic Panel: Recent Labs  Lab 05/22/20 2107 05/22/20 2137 05/23/20 0504  NA 135 139 136  K 5.0 4.8 3.9  CL 101 100 101  CO2 26  --  24  GLUCOSE 209* 210* 114*  BUN CREATININE 1.33* 1.10 1.14  CALCIUM 9.1  --  9.4   Liver Function Tests: Recent Labs  Lab 05/22/20 2107  AST 21  ALT 15  ALKPHOS 54  BILITOT 1.1  PROT 6.3*  ALBUMIN 3.5   No results for input(s): LIPASE, AMYLASE in the last 168 hours. No results for input(s): AMMONIA in the last 168 hours. CBC: Recent Labs  Lab 05/22/20 2107 05/22/20 2137 05/23/20 0504  WBC 7.0  --  6.8  NEUTROABS 4.5  --   --  HGB 12.3* 12.9* 11.8*  HCT 38.0* 38.0* 36.9*  MCV 94.8  --  94.9  PLT 213  --  217   Cardiac Enzymes: No results for input(s): CKTOTAL, CKMB, CKMBINDEX, TROPONINI in the last 168 hours. BNP: Invalid input(s): POCBNP CBG: Recent Labs  Lab 05/22/20 2106 05/23/20 0753 05/23/20 1244 05/23/20 1644 05/24/20 0624  GLUCAP 187* 109* 150* 107* 129*   D-Dimer No results for input(s): DDIMER in the last 72 hours. Hgb A1c Recent Labs    05/23/20 0504  HGBA1C 6.7*   Lipid Profile Recent Labs     05/23/20 0504  CHOL 106  HDL 32*  LDLCALC 64  TRIG 51  CHOLHDL 3.3   Thyroid function studies No results for input(s): TSH, T4TOTAL, T3FREE, THYROIDAB in the last 72 hours.  Invalid input(s): FREET3 Anemia work up No results for input(s): VITAMINB12, FOLATE, FERRITIN, TIBC, IRON, RETICCTPCT in the last 72 hours. Urinalysis    Component Value Date/Time   COLORURINE YELLOW 03/14/2020 1719   APPEARANCEUR CLEAR 03/14/2020 1719   LABSPEC 1.023 03/14/2020 1719   PHURINE 5.0 03/14/2020 1719   GLUCOSEU NEGATIVE 03/14/2020 1719   HGBUR SMALL (A) 03/14/2020 1719   BILIRUBINUR NEGATIVE 03/14/2020 1719   KETONESUR 5 (A) 03/14/2020 1719   PROTEINUR 100 (A) 03/14/2020 1719   NITRITE NEGATIVE 03/14/2020 1719   LEUKOCYTESUR NEGATIVE 03/14/2020 1719   Sepsis Labs Invalid input(s): PROCALCITONIN,  WBC,  LACTICIDVEN Microbiology Recent Results (from the past 240 hour(s))  Resp Panel by RT-PCR (Flu A&B, Covid) Nasopharyngeal Swab     Status: None   Collection Time: 05/22/20 11:58 PM   Specimen: Nasopharyngeal Swab; Nasopharyngeal(NP) swabs in vial transport medium  Result Value Ref Range Status   SARS Coronavirus 2 by RT PCR NEGATIVE NEGATIVE Final    Comment: (NOTE) SARS-CoV-2 target nucleic acids are NOT DETECTED.  The SARS-CoV-2 RNA is generally detectable in upper respiratory specimens during the acute phase of infection. The lowest concentration of SARS-CoV-2 viral copies this assay can detect is 138 copies/mL. A negative result does not preclude SARS-Cov-2 infection and should not be used as the sole basis for treatment or other patient management decisions. A negative result may occur with  improper specimen collection/handling, submission of specimen other than nasopharyngeal swab, presence of viral mutation(s) within the areas targeted by this assay, and inadequate number of viral copies(<138 copies/mL). A negative result must be combined with clinical observations, patient  history, and epidemiological information. The expected result is Negative.  Fact Sheet for Patients:  BloggerCourse.com  Fact Sheet for Healthcare Providers:  SeriousBroker.it  This test is no t yet approved or cleared by the [place] FDA and  has been authorized for detection and/or diagnosis of SARS-CoV-2 by FDA under an Emergency Use Authorization (EUA). This EUA will remain  in effect (meaning this test can be used) for the duration of the COVID-19 declaration under Section 564(b)(1) of the Act, 21 U.S.C.section 360bbb-3(b)(1), unless the authorization is terminated  or revoked sooner.       Influenza A by PCR NEGATIVE NEGATIVE Final   Influenza B by PCR NEGATIVE NEGATIVE Final    Comment: (NOTE) The Xpert Xpress SARS-CoV-2/FLU/RSV plus assay is intended as an aid in the diagnosis of influenza from Nasopharyngeal swab specimens and should not be used as a sole basis for treatment. Nasal washings and aspirates are unacceptable for Xpert Xpress SARS-CoV-2/FLU/RSV testing.  Fact Sheet for Patients: BloggerCourse.com  Fact Sheet for Healthcare Providers: SeriousBroker.it  This test is not  yet approved or cleared by the [place] and has been authorized for detection and/or diagnosis of SARS-CoV-2 by FDA under an Emergency Use Authorization (EUA). This EUA will remain in effect (meaning this test can be used) for the duration of the COVID-19 declaration under Section 564(b)(1) of the Act, 21 U.S.C. section 360bbb-3(b)(1), unless the authorization is terminated or revoked.  Performed at [facility], [address], [place], [place] [address]      Time coordinating discharge: Over 30 minutes  SIGNED:   Cipriano Bunker, MD  [facility] 05/24/2020, 9:59 AM Pager   If 7PM-7AM, please contact night-coverage [email or web address]

## 2020-05-24 NOTE — Discharge Instructions (Signed)
Advised to follow up PCP in one week. Advised to take Aspirin and plavix for 3 weeks followed by plavix only. Advise dto follow up neurology in 4 weeks.

## 2020-05-24 NOTE — TOC Initial Note (Addendum)
Transition of Care [facility]) - Initial/Assessment Note    Patient Details  Name: Dominic Goodwin MRN: [id number] Date of Birth: January 14, 1941  [facility] CM/SW Contact:    Kingsley Plan, RN Phone Number: 05/24/2020, 10:53 AM  Clinical Narrative:                 Patient from home alone, but has family close by.    PCP is DR Louis Meckel at [facility] fax [phone], [place] SW is Dory Horn ext [phone] ( [phone])   Orders for HHPT safety evaluation and HHRN for medication assistance.    Patient wants home health arranged through [place]. NCM faxed orders and clinicals to DR Paruchuri and called  [facility] spoke to [place] ( covering). [facility] PCP will have to approve. Requested [facility]. [facility] information also [facility] to [facility]  Orders and clinicals faxed. Bianna will alert DR Paruchuri   Expected Discharge Plan: Home w Home Health Services Barriers to Discharge: No Barriers Identified   Patient Goals and CMS Choice Patient states their goals for this hospitalization and ongoing recovery are:: to return to home CMS Medicare.gov Compare Post Acute Care list provided to:: Patient Choice offered to / list presented to : Patient  Expected Discharge Plan and Services Expected Discharge Plan: Home w Home Health Services   Discharge Planning Services: CM Consult Post Acute Care Choice: Home Health Living arrangements for the past 2 months: Single Family Home Expected Discharge Date: 05/24/20               DME Arranged: N/A           HH Agency: [facility] Date HH Agency Contacted: 05/24/20 Time HH Agency Contacted: 1052 Representative spoke with at [facility] Agency: [place]  Prior Living Arrangements/Services Living arrangements for the past 2 months: Single Family Home Lives with:: Self Patient language and need for interpreter reviewed:: Yes Do you feel safe going back to the place where you live?: Yes      Need for Family Participation in  Patient Care: Yes (Comment) Care giver support system in place?: Yes (comment)   Criminal Activity/Legal Involvement Pertinent to Current Situation/Hospitalization: No - Comment as needed  Activities of Daily Living Home Assistive Devices/Equipment: None ADL Screening (condition at time of admission) Patient's cognitive ability adequate to safely complete daily activities?: Yes Is the patient deaf or have difficulty hearing?: No Does the patient have difficulty seeing, even when wearing glasses/contacts?: No Does the patient have difficulty concentrating, remembering, or making decisions?: No Patient able to express need for assistance with ADLs?: Yes Does the patient have difficulty dressing or bathing?: No Independently performs ADLs?: Yes (appropriate for developmental age) Does the patient have difficulty walking or climbing stairs?: No Weakness of Legs: None Weakness of Arms/Hands: None  Permission Sought/Granted   Permission granted to share information with : No              Emotional Assessment Appearance:: Appears stated age Attitude/Demeanor/Rapport: Engaged Affect (typically observed): Accepting Orientation: : Oriented to Self,Oriented to Place,Oriented to  Time,Oriented to Situation   Psych Involvement: No (comment)  Admission diagnosis:  Hypertensive encephalopathy [I67.4] Facial droop [R29.810] Hypertensive urgency [I16.0] Patient Active Problem List   Diagnosis Date Noted   Hypertensive encephalopathy 05/23/2020   Facial droop 05/23/2020   TIA (transient ischemic attack) 05/23/2020    Class: Acute   Hypertensive urgency 05/22/2020   COVID-19 virus infection 03/14/2020  Coronary artery disease    LFT elevation    Weakness    Atypical chest pain 05/14/2018   Dyspnea on exertion 11/18/2013   Essential hypertension 11/18/2013   Hyperlipidemia 11/18/2013   Type 2 diabetes mellitus (HCC) 11/18/2013   Obstructive sleep apnea 11/18/2013    PCP:  Clinic, [facility] Pharmacy:   [facility] - [place], [place] - [address] [place] [address] Phone: [phone] Fax: [phone]  [facility] [address] [place], [place] - [address] [place] [address] Phone: [phone] Fax: [phone]     Social Determinants of Health (SDOH) Interventions    Readmission Risk Interventions No flowsheet data found.

## 2020-07-05 ENCOUNTER — Other Ambulatory Visit: Payer: Self-pay

## 2020-07-05 ENCOUNTER — Encounter (HOSPITAL_COMMUNITY): Payer: Self-pay | Admitting: Emergency Medicine

## 2020-07-05 ENCOUNTER — Emergency Department (HOSPITAL_COMMUNITY)
Admission: EM | Admit: 2020-07-05 | Discharge: 2020-07-05 | Disposition: A | Discharge status: Home / Self Care | Payer: No Typology Code available for payment source | Attending: Emergency Medicine | Admitting: Emergency Medicine

## 2020-07-05 DIAGNOSIS — R300 Dysuria: Secondary | ICD-10-CM | POA: Diagnosis not present

## 2020-07-05 DIAGNOSIS — Z7982 Long term (current) use of aspirin: Secondary | ICD-10-CM | POA: Insufficient documentation

## 2020-07-05 DIAGNOSIS — I119 Hypertensive heart disease without heart failure: Secondary | ICD-10-CM | POA: Diagnosis not present

## 2020-07-05 DIAGNOSIS — R58 Hemorrhage, not elsewhere classified: Secondary | ICD-10-CM | POA: Diagnosis not present

## 2020-07-05 DIAGNOSIS — I2510 Atherosclerotic heart disease of native coronary artery without angina pectoris: Secondary | ICD-10-CM | POA: Insufficient documentation

## 2020-07-05 DIAGNOSIS — Z87891 Personal history of nicotine dependence: Secondary | ICD-10-CM | POA: Insufficient documentation

## 2020-07-05 DIAGNOSIS — Z79899 Other long term (current) drug therapy: Secondary | ICD-10-CM | POA: Insufficient documentation

## 2020-07-05 DIAGNOSIS — Z8616 Personal history of COVID-19: Secondary | ICD-10-CM | POA: Insufficient documentation

## 2020-07-05 DIAGNOSIS — R319 Hematuria, unspecified: Secondary | ICD-10-CM | POA: Diagnosis not present

## 2020-07-05 DIAGNOSIS — Z7984 Long term (current) use of oral hypoglycemic drugs: Secondary | ICD-10-CM | POA: Diagnosis not present

## 2020-07-05 DIAGNOSIS — E119 Type 2 diabetes mellitus without complications: Secondary | ICD-10-CM | POA: Insufficient documentation

## 2020-07-05 LAB — URINALYSIS, ROUTINE W REFLEX MICROSCOPIC
Bacteria, UA: NONE SEEN
Bilirubin Urine: NEGATIVE
Glucose, UA: NEGATIVE mg/dL
Hgb urine dipstick: NEGATIVE
Ketones, ur: NEGATIVE mg/dL
Leukocytes,Ua: NEGATIVE
Nitrite: NEGATIVE
Protein, ur: 30 mg/dL — AB
Specific Gravity, Urine: 1.027 (ref 1.005–1.030)
pH: 5 (ref 5.0–8.0)

## 2020-07-05 LAB — CBC WITH DIFFERENTIAL/PLATELET
Abs Immature Granulocytes: 0.02 10*3/uL (ref 0.00–0.07)
Basophils Absolute: 0 10*3/uL (ref 0.0–0.1)
Basophils Relative: 0 %
Eosinophils Absolute: 0.1 10*3/uL (ref 0.0–0.5)
Eosinophils Relative: 1 %
HCT: 36.9 % — ABNORMAL LOW (ref 39.0–52.0)
Hemoglobin: 12 g/dL — ABNORMAL LOW (ref 13.0–17.0)
Immature Granulocytes: 0 %
Lymphocytes Relative: 32 %
Lymphs Abs: 2 10*3/uL (ref 0.7–4.0)
MCH: 30.9 pg (ref 26.0–34.0)
MCHC: 32.5 g/dL (ref 30.0–36.0)
MCV: 95.1 fL (ref 80.0–100.0)
Monocytes Absolute: 0.5 10*3/uL (ref 0.1–1.0)
Monocytes Relative: 8 %
Neutro Abs: 3.7 10*3/uL (ref 1.7–7.7)
Neutrophils Relative %: 59 %
Platelets: 227 10*3/uL (ref 150–400)
RBC: 3.88 MIL/uL — ABNORMAL LOW (ref 4.22–5.81)
RDW: 13.1 % (ref 11.5–15.5)
WBC: 6.3 10*3/uL (ref 4.0–10.5)
nRBC: 0 % (ref 0.0–0.2)

## 2020-07-05 LAB — BASIC METABOLIC PANEL
Anion gap: 12 (ref 5–15)
BUN: 14 mg/dL (ref 8–23)
CO2: 22 mmol/L (ref 22–32)
Calcium: 9.5 mg/dL (ref 8.9–10.3)
Chloride: 103 mmol/L (ref 98–111)
Creatinine, Ser: 1.22 mg/dL (ref 0.61–1.24)
GFR, Estimated: >60 mL/min (ref >60)
Glucose, Bld: 163 mg/dL — ABNORMAL HIGH (ref 70–99)
Potassium: 3.8 mmol/L (ref 3.5–5.1)
Sodium: 137 mmol/L (ref 135–145)

## 2020-07-05 LAB — POC OCCULT BLOOD, ED: Fecal Occult Bld: NEGATIVE

## 2020-07-05 NOTE — ED Triage Notes (Signed)
Emergency Medicine Provider Triage Evaluation Note  Dominic Goodwin , a 80 y.o. male  was evaluated in triage.  Pt complains of hematuria. Pt noticed several drops of blood on the floor and also in the bathroom stall this morning.  The believes it may have come from his urine.  Unsure if he actually saw blood when urinating.  Mild burning on urination.   Review of Systems  Positive: Hematuria, mild urinary discomfort Negative: abd pain, back pain, fever, trauma  Physical Exam  BP (!) 151/74 (BP Location: Right Arm)   Pulse 76   Temp 97.9 F (36.6 C) (Oral)   Resp 18   SpO2 93%  Gen:   Awake, no distress   HEENT:  Atraumatic  Resp:  Normal effort  Cardiac:  Normal rate  Abd:   Nondistended, nontender  MSK:   Moves extremities without difficulty  Neuro:  Speech clear   Medical Decision Making  Medically screening exam initiated at 12:18 PM.  Appropriate orders placed.  [name] was informed that the remainder of the evaluation will be completed by another provider, this initial triage assessment does not replace that evaluation, and the importance of remaining in the ED until their evaluation is complete.  Clinical Impression  hematuria   Fayrene Helper, Cordelia Poche 07/05/20 1219

## 2020-07-05 NOTE — Discharge Instructions (Addendum)
At this time there does not appear to be the presence of an emergent medical condition, however there is always the potential for conditions to change. Please read and follow the below instructions.  Please return to the Emergency Department immediately for any new or worsening symptoms. Please be sure to follow up with your Primary Care Provider today to schedule a follow-up appointment.  Go to the nearest Emergency Department immediately if: You have fever or chills Your bleeding does not stop. You feel light-headed or you faint. You feel weak. You have severe cramps in your back or abdomen. You pass large blood clots in your stool. Your symptoms are getting worse. You have chest pain or fast heartbeats. You have any new/concerning or worsening of symptoms   Please read the additional information packets attached to your discharge summary.  Do not take your medicine if  develop an itchy rash, swelling in your mouth or lips, or difficulty breathing; call 911 and seek immediate emergency medical attention if this occurs.  You may review your lab tests and imaging results in their entirety on your MyChart account.  Please discuss all results of fully with your primary care provider and other specialist at your follow-up visit.  Note: Portions of this text may have been transcribed using voice recognition software. Every effort was made to ensure accuracy; however, inadvertent computerized transcription errors may still be present.

## 2020-07-05 NOTE — ED Triage Notes (Signed)
Pt coming from home, complaint of hematuria that he noticed began this morning. VSS.

## 2020-07-05 NOTE — ED Provider Notes (Signed)
[facility] EMERGENCY DEPARTMENT Provider Note   CSN: [id number] Arrival date & time: 07/05/20  1158     History Chief Complaint  Patient presents with  . Hematuria    Dominic Goodwin is a 80 y.o. male history CAD, CVA, hypertension, hyperlipidemia, MI, diabetes, OSA, on Plavix  Patient after today for concern of hematuria, patient reports that after in the shower today he looked on the floor and noticed a few drops of blood on the ground.  He then walked to the other side of the room to dry off O2 back down again and noticed a few more drops of blood, patient is concerned it is coming from his penis.  Patient reports mild dysuria a discomfort sensation  Patient denies fever/chills, chest pain/shortness of breath, abdominal pain, nausea/vomiting, testicular pain/swelling or any additional concerns  HPI     Past Medical History:  Diagnosis Date  . Coronary artery disease   . Dyspnea on exertion   . Elevated PSA   . History of stress test    Did not show evidence of ischemia, however, it showed somewhat diminished exercise tolerance and hypertensive response to exercise  . Hx of echocardiogram 09/22/2008   EF >55% showed normal left ventricular systolic functon and grade 1 diastolic dysfunction.  . Hyperlipidemia   . Hypertension   . MI (myocardial infarction) (HCC) 2007   which was treated with medical therapy.   . Sleep apnea   . Type 2 diabetes mellitus [facility])     Patient Active Problem List   Diagnosis Date Noted  . Hypertensive encephalopathy 05/23/2020  . Facial droop 05/23/2020  . TIA (transient ischemic attack) 05/23/2020    Class: Acute  . Hypertensive urgency 05/22/2020  . COVID-19 virus infection 03/14/2020  . Coronary artery disease   . LFT elevation   . Weakness   . Atypical chest pain 05/14/2018  . Dyspnea on exertion 11/18/2013  . Essential hypertension 11/18/2013  . Hyperlipidemia 11/18/2013  . Type 2 diabetes mellitus (HCC)  11/18/2013  . Obstructive sleep apnea 11/18/2013    History reviewed. No pertinent surgical history.     No family history on file.  Social History   Tobacco Use  . Smoking status: Former Smoker    Quit date: 11/18/2000    Years since quitting: 19.6  . Smokeless tobacco: Never Used  Substance Use Topics  . Alcohol use: No  . Drug use: No    Home Medications Prior to Admission medications   Medication Sig Start Date End Date Taking? Authorizing Provider  acetaminophen (TYLENOL) 325 MG tablet Take 325 mg by mouth 3 (three) times daily as needed for mild pain.    [provider]  aspirin EC 81 MG EC tablet Take 1 tablet (81 mg total) by mouth daily. Swallow whole. 05/25/20   Cipriano Bunker, MD  atorvastatin (LIPITOR) 80 MG tablet Take 80 mg by mouth daily. 10/14/13   [provider]  carboxymethylcellul-glycerin (REFRESH OPTIVE) 0.5-0.9 % ophthalmic solution Place 1 drop into both eyes 3 (three) times daily as needed for dry eyes.    [provider]  clopidogrel (PLAVIX) 75 MG tablet Take 1 tablet (75 mg total) by mouth daily. 05/24/20 05/24/21  Cipriano Bunker, MD  donepezil (ARICEPT) 10 MG tablet Take 10 mg by mouth at bedtime.    [provider]  finasteride (PROSCAR) 5 MG tablet Take 5 mg by mouth daily.    [provider]  glipiZIDE (GLUCOTROL) 10 MG tablet Take  10 mg by mouth daily before breakfast.    [provider]  isosorbide mononitrate (IMDUR) 30 MG 24 hr tablet Take 30 mg by mouth daily. 03/11/13   [provider]  losartan (COZAAR) 100 MG tablet Take 100 mg by mouth daily. 11/04/13   [provider]  metFORMIN (GLUCOPHAGE) 1000 MG tablet Take 1,000 mg by mouth 2 (two) times daily with a meal.    [provider]  metoprolol tartrate (LOPRESSOR) 50 MG tablet Take 50 mg by mouth 2 (two) times daily.    [provider]  mirabegron ER (MYRBETRIQ) 50 MG TB24 tablet Take 50 mg by mouth daily.     [provider]  primidone (MYSOLINE) 50 MG tablet Take 1 tablet by mouth 2 (two) times daily. 07/25/15 05/23/20  [provider]  sodium chloride (OCEAN) 0.65 % SOLN nasal spray Place 2 sprays into both nostrils daily.    [provider]  tamsulosin (FLOMAX) 0.4 MG CAPS capsule Take 0.4 mg by mouth daily.    [provider]  Vitamin D3 (VITAMIN D) 25 MCG tablet Take 1,000 Units by mouth daily.    [provider]    Allergies    Patient has no known allergies.  Review of Systems   Review of Systems Ten systems are reviewed and are negative for acute change except as noted in the HPI  Physical Exam Updated Vital Signs BP (!) 151/86   Pulse 65   Temp 97.9 F (36.6 C) (Oral)   Resp 11   SpO2 100%   Physical Exam Constitutional:      General: He is not in acute distress.    Appearance: Normal appearance. He is well-developed. He is not ill-appearing or diaphoretic.  HENT:     Head: Normocephalic and atraumatic.  Eyes:     General: Vision grossly intact. Gaze aligned appropriately.     Pupils: Pupils are equal, round, and reactive to light.  Neck:     Trachea: Trachea and phonation normal.  Pulmonary:     Effort: Pulmonary effort is normal. No respiratory distress.  Abdominal:     General: There is no distension.     Palpations: Abdomen is soft.     Tenderness: There is no abdominal tenderness. There is no guarding or rebound.  Genitourinary:    Comments: Chaperone present during genital exam McK[place].  No external genital lesions noted, no bumps on head of penis, specifically no vesicles concerning for herpes or chancre suggestive of syphilis.  No pain with palpation of the penis/glans, no discharge or urethritis noted.  Scrotum and testicles without erythema/swelling or tenderness to palpation. Cremasteric reflex intact bilaterally. No palpable hernia noted.  - Rectal examination shows no external hemorrhoids fissures or other  abnormalities.  Light brown stool in the rectum no obvious blood.  Musculoskeletal:        General: Normal range of motion.     Cervical back: Normal range of motion.  Feet:     Right foot:     Protective Sensation: 3 sites tested. 3 sites sensed.     Left foot:     Protective Sensation: 3 sites tested. 3 sites sensed.  Skin:    General: Skin is warm and dry.     Comments: No cuts or bleeding of the lower extremities.  Neurological:     Mental Status: He is alert.     GCS: GCS eye subscore is 4. GCS verbal subscore is 5. GCS motor subscore is  6.     Comments: Speech is clear and goal oriented, follows commands Major Cranial nerves without deficit, no facial droop Moves extremities without ataxia, coordination intact  Psychiatric:        Behavior: Behavior normal.     ED Results / Procedures / Treatments   Labs (all labs ordered are listed, but only abnormal results are displayed) Labs Reviewed  BASIC METABOLIC PANEL - Abnormal; Notable for the following components:      Result Value   Glucose, Bld 163 (*)    All other components within normal limits  CBC WITH DIFFERENTIAL/PLATELET - Abnormal; Notable for the following components:   RBC 3.88 (*)    Hemoglobin 12.0 (*)    HCT 36.9 (*)    All other components within normal limits  URINALYSIS, ROUTINE W REFLEX MICROSCOPIC - Abnormal; Notable for the following components:   Protein, ur 30 (*)    All other components within normal limits  POC OCCULT BLOOD, ED    EKG None  Radiology No results found.  Procedures Procedures   Medications Ordered in ED Medications - No data to display  ED Course  I have reviewed the triage vital signs and the nursing notes.  Pertinent labs & imaging results that were available during my care of the patient were reviewed by me and considered in my medical decision making (see chart for details).    MDM Rules/Calculators/A&P                         Additional history obtained  from: 1. Nursing notes from this visit. 2. Review of [facility] medical records, [facility]. ----------- I ordered, reviewed and interpreted labs which include: Urinalysis shows no hemoglobin or RBCs, no evidence for infection. BMP shows no emergent electrolyte derangements, AKI or gap. Hemoccult is negative. CBC shows baseline hemoglobin of 12.0, no leukocytosis or thrombocytopenia.  No clear cause of the few drops of blood patients on the floor earlier.  No evidence of skin break of the legs genitals/perineum.  No bleeding on examination today.  Encourage patient to call his primary care provider for a follow-up visit and to return to the ER for any new or worsening symptoms or if he notices more blood.  At this time there does not appear to be any evidence of an acute emergency medical condition and the patient appears stable for discharge with appropriate outpatient follow up. Diagnosis was discussed with patient who verbalizes understanding of care plan and is agreeable to discharge. I have discussed return precautions with patient who verbalizes understanding. Patient encouraged to follow-up with their PCP. All questions answered.  Patient seen and evaluated by Dr. Denton Lank during this visit who agrees with discharge and outpatient follow-up.  Note: Portions of this report may have been transcribed using voice recognition software. Every effort was made to ensure accuracy; however, inadvertent computerized transcription errors may still be present. Final Clinical Impression(s) / ED Diagnoses Final diagnoses:  Bleeding of unknown origin    Rx / DC Orders ED Discharge Orders    None       Elizabeth Palau 07/05/20 1547    Cathren Laine, MD 07/07/20 1557

## 2020-07-05 NOTE — ED Notes (Signed)
RN reviewed discharge instructions w/ pt. Follow up care reviewed, pt had no further questions 

## 2021-02-26 DIAGNOSIS — N401 Enlarged prostate with lower urinary tract symptoms: Secondary | ICD-10-CM | POA: Insufficient documentation

## 2021-02-26 DIAGNOSIS — N3281 Overactive bladder: Secondary | ICD-10-CM | POA: Insufficient documentation

## 2021-02-26 DIAGNOSIS — F331 Major depressive disorder, recurrent, moderate: Secondary | ICD-10-CM | POA: Insufficient documentation

## 2021-02-26 DIAGNOSIS — F32A Depression, unspecified: Secondary | ICD-10-CM | POA: Insufficient documentation

## 2021-11-19 IMAGING — CT CT ANGIO NECK
2 of 7 series · 8 of 33 positions shown · IV contrast (omnipaque)
Comparison: None.

CLINICAL DATA: Left facial droop and headache



[Series 5: cta neck/head · axial · 0.57mm/px · z∈[-219,-81]mm · 2 of 207 slices shown]
[im 69/207  soft-tissue]
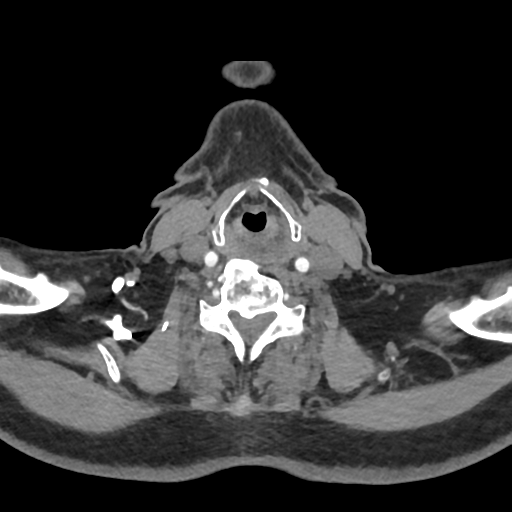
[im 138/207  soft-tissue]
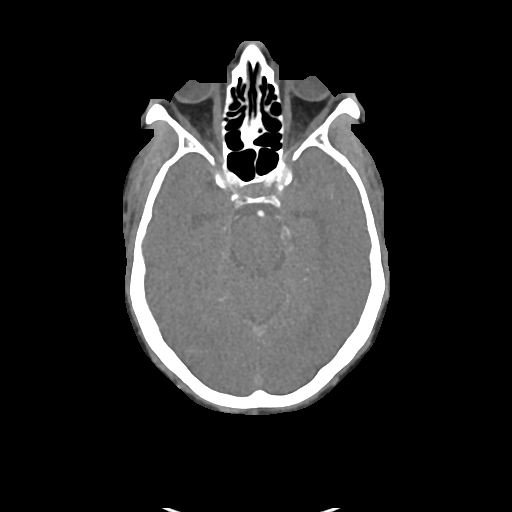

[Series 7: ax thins · axial · 0.39mm/px · z∈[-298,-8]mm · 6 of 406 slices shown]
[im 58/406  soft-tissue]
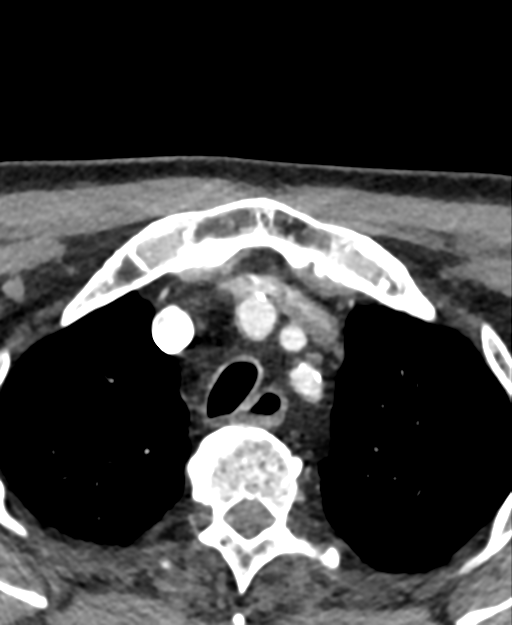
[im 116/406  bone]
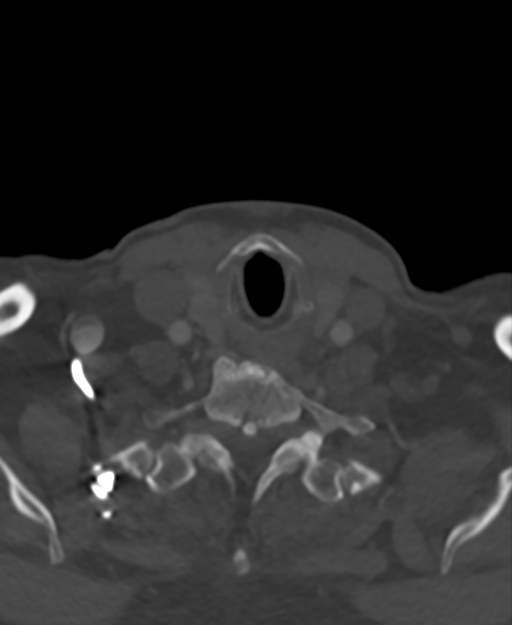
[im 174/406  soft-tissue]
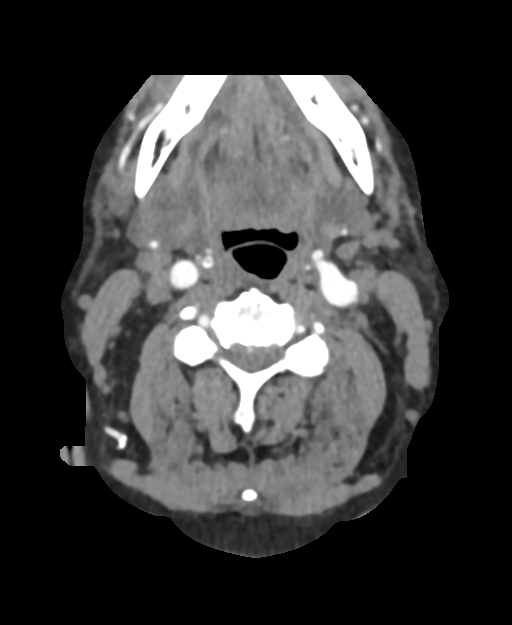
[im 232/406  bone]
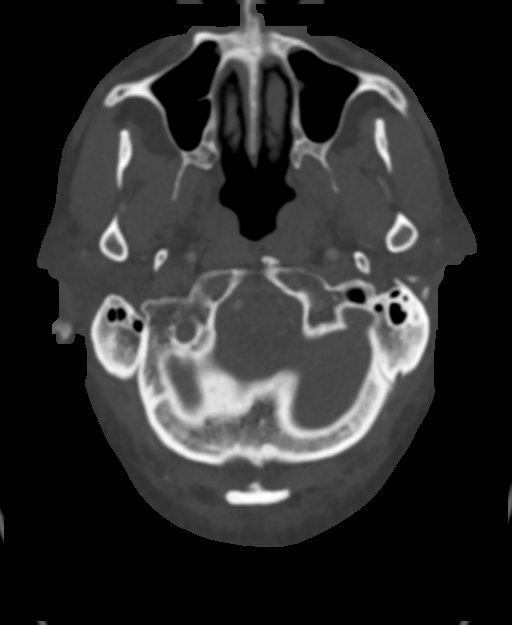
[im 290/406  soft-tissue]
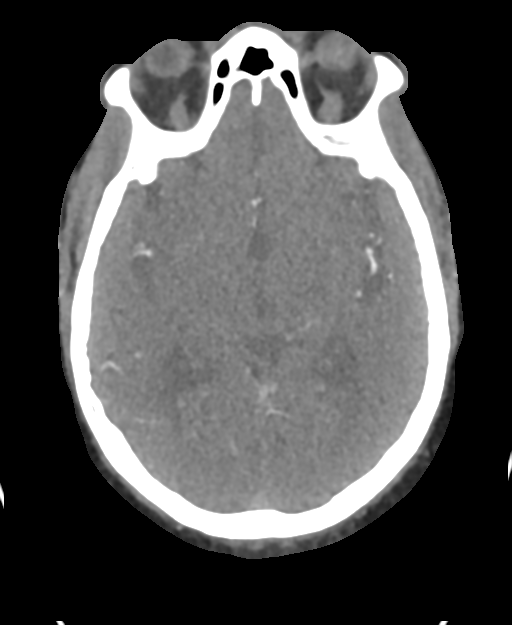
[im 348/406  bone]
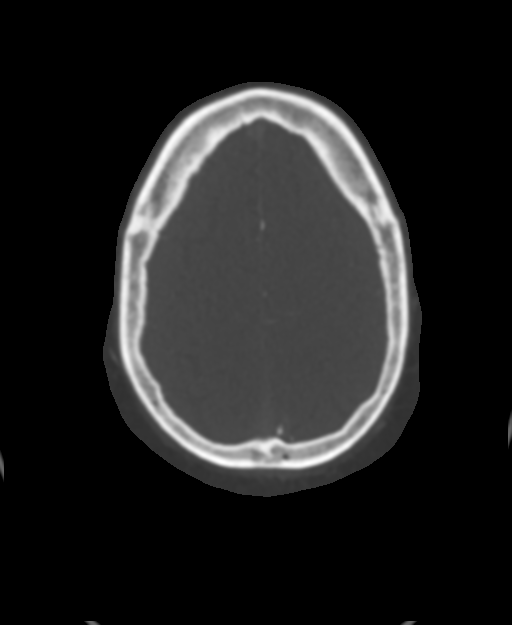

[8 of 33 positions shown; findings below may reference images not displayed]

FINDINGS: CTA NECK FINDINGS

SKELETON: There is no bony spinal canal stenosis. No lytic or
blastic lesion.

OTHER NECK: Normal pharynx, larynx and major salivary glands. No
cervical lymphadenopathy. Unremarkable thyroid gland.

UPPER CHEST: No pneumothorax or pleural effusion. No nodules or
masses.

AORTIC ARCH:

There is calcific atherosclerosis of the aortic arch. There is no
aneurysm, dissection or hemodynamically significant stenosis of the
visualized portion of the aorta. Conventional 3 vessel aortic
branching pattern. The visualized proximal subclavian arteries are
widely patent.

RIGHT CAROTID SYSTEM: Normal without aneurysm, dissection or
stenosis.

LEFT CAROTID SYSTEM: Normal without aneurysm, dissection or
stenosis.

VERTEBRAL ARTERIES: Right dominant configuration. Both origins are
clearly patent. There is no dissection, occlusion or flow-limiting
stenosis to the skull base (V1-V3 segments).

CTA HEAD FINDINGS

POSTERIOR CIRCULATION:

--Vertebral arteries: Normal V4 segments.

--Inferior cerebellar arteries: Normal.

--Basilar artery: Normal.

--Superior cerebellar arteries: Normal.

--Posterior cerebral arteries (PCA): Normal.

ANTERIOR CIRCULATION:

--Intracranial internal carotid arteries: Normal.

--Anterior cerebral arteries (ACA): Normal. Both A1 segments are
present. Patent anterior communicating artery (a-comm).

--Middle cerebral arteries (MCA): Normal.

VENOUS SINUSES: As permitted by contrast timing, patent.

ANATOMIC VARIANTS: None

Review of the MIP images confirms the above findings.
IMPRESSION: Normal CTA of the head and neck.

Aortic atherosclerosis ([id number]-[id number]).

## 2021-11-19 IMAGING — CT CT HEAD CODE STROKE
4 of 5 series · 15 of 47 positions shown, 17 images · non-contrast
Comparison: None.

CLINICAL DATA: Code stroke.  Left facial droop and dizziness

EXAM:
CT HEAD WITHOUT CONTRAST
TECHNIQUE: Contiguous axial images were obtained from the base of the skull
through the vertex without intravenous contrast.

[Series 2: head wo · axial · 0.46mm/px · z∈[-102,+3]mm · 4 of 35 slices shown (1 of 2)]
[im 7/35  brain]
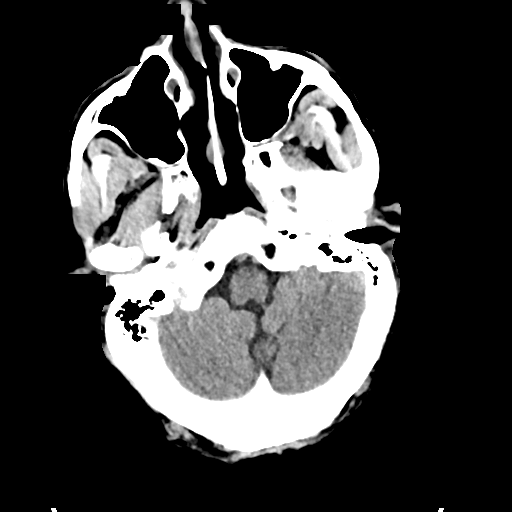
[im 14/35  brain]
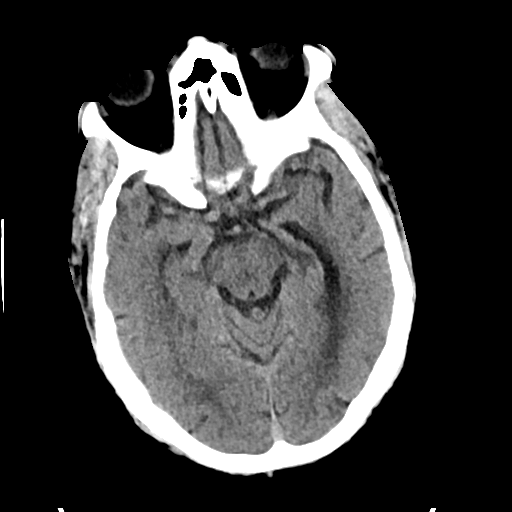
[im 21/35  brain]
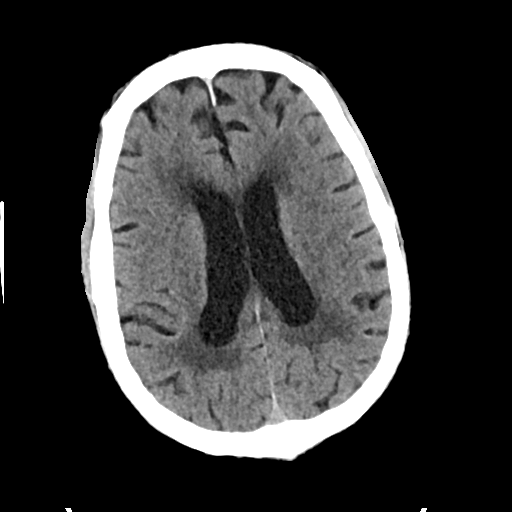
[im 28/35  brain]
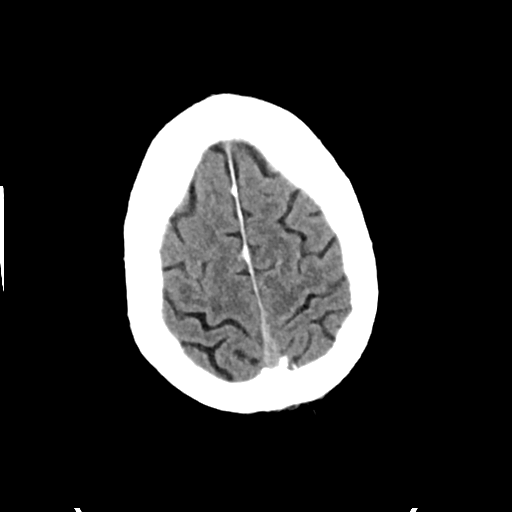

[Series 5: cor soft · coronal · 0.34mm/px · 3 of 78 slices shown]
[im 26/78  brain]
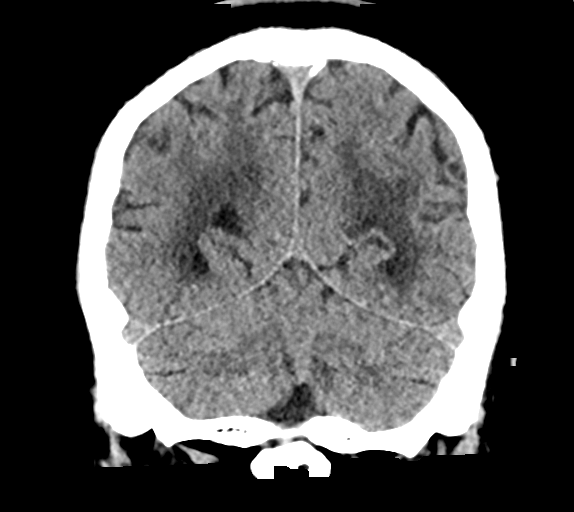
[im 35/78  brain]
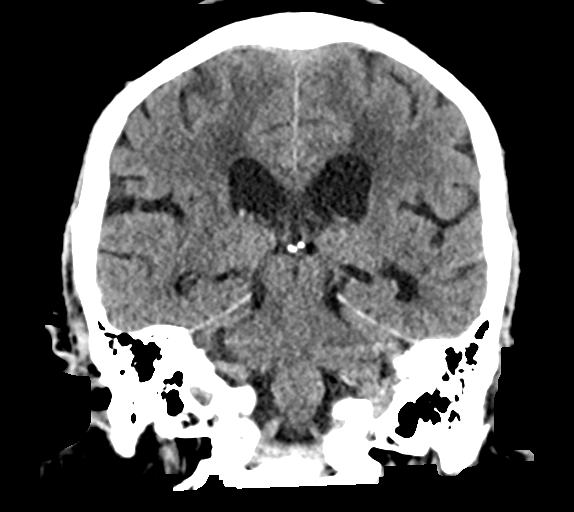
[im 43/78  brain]
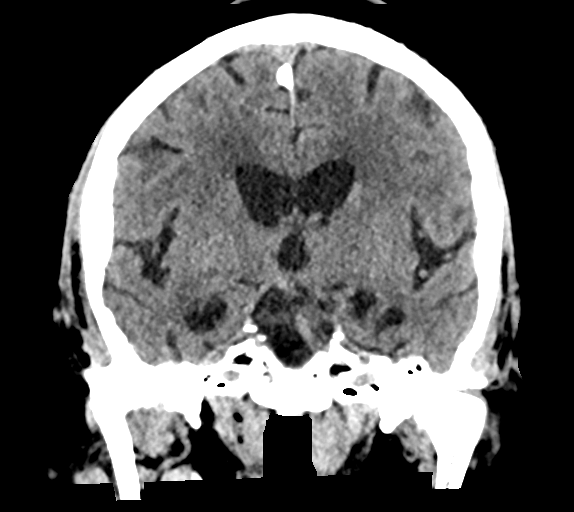

[Series 6: sag soft · sagittal · 0.34mm/px · 3 of 61 slices shown]
[im 21/61  brain]
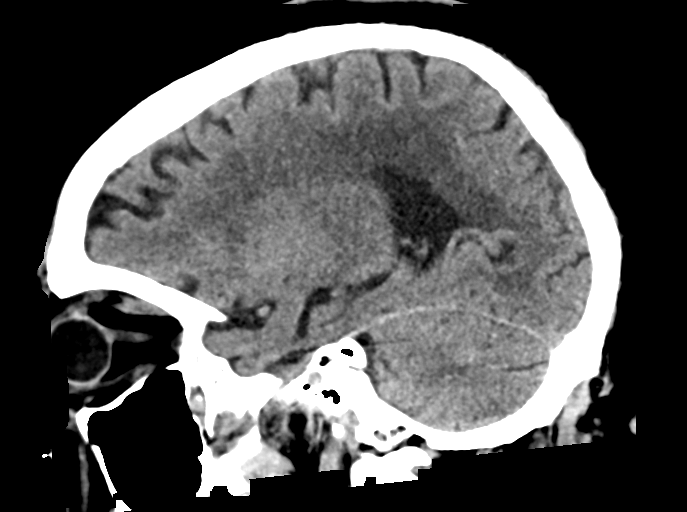
[im 31/61  brain]
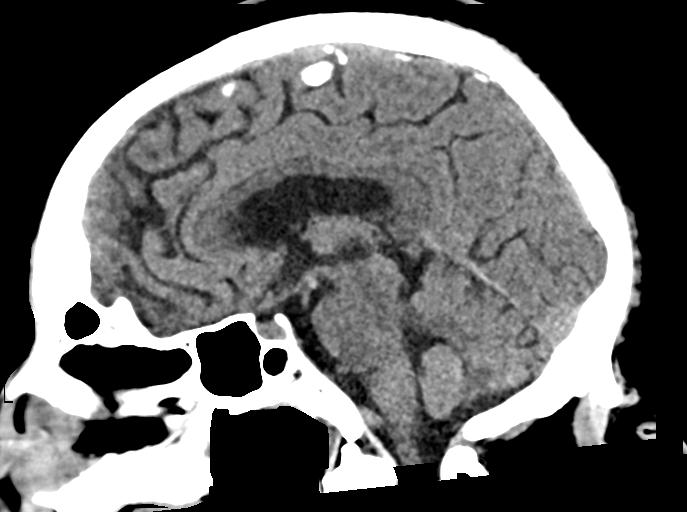
[im 41/61  brain]
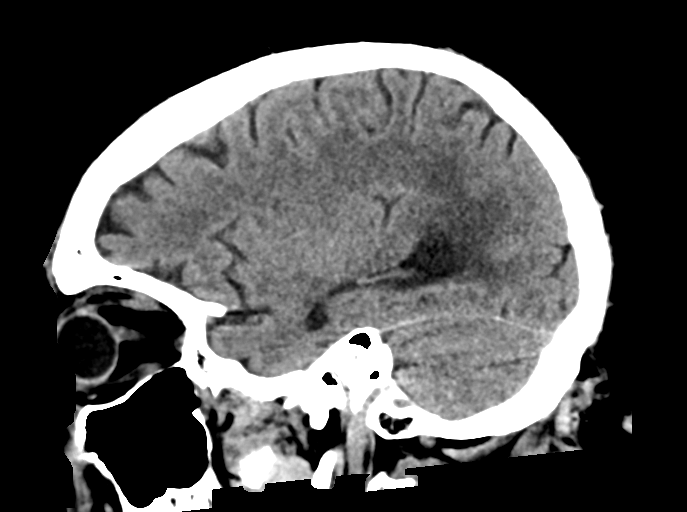

[Series 7: head wo · axial · 0.38mm/px · z∈[-95,+19]mm · 5 of 35 slices shown, 7 images (2 of 2)]
[im 6/35  brain]
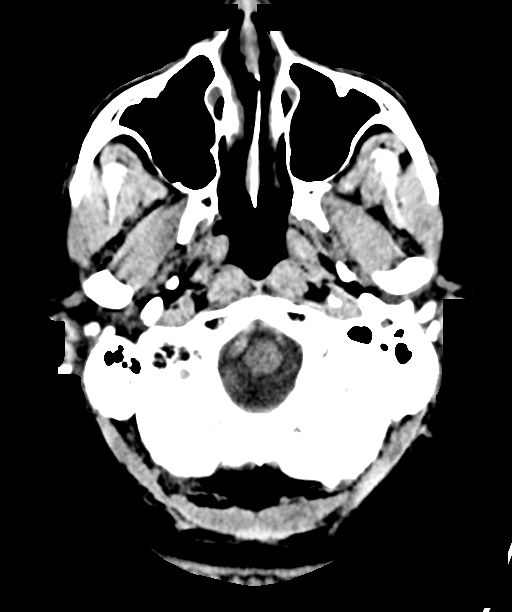
[im 6/35  bone]
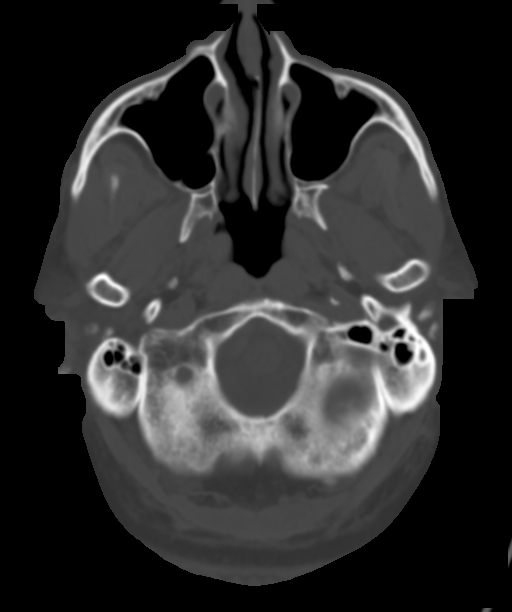
[im 12/35  brain]
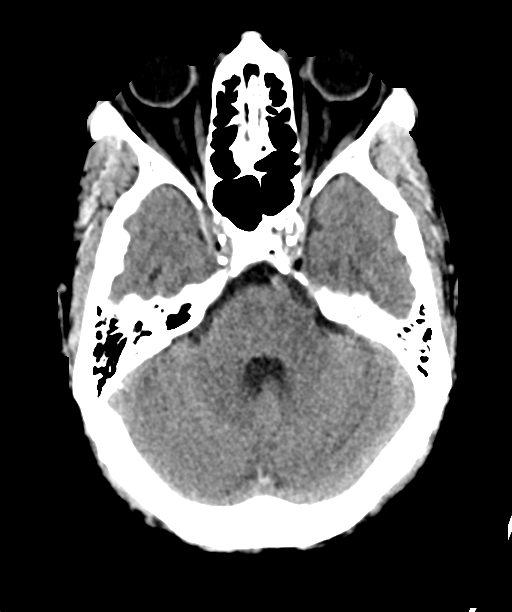
[im 18/35  brain]
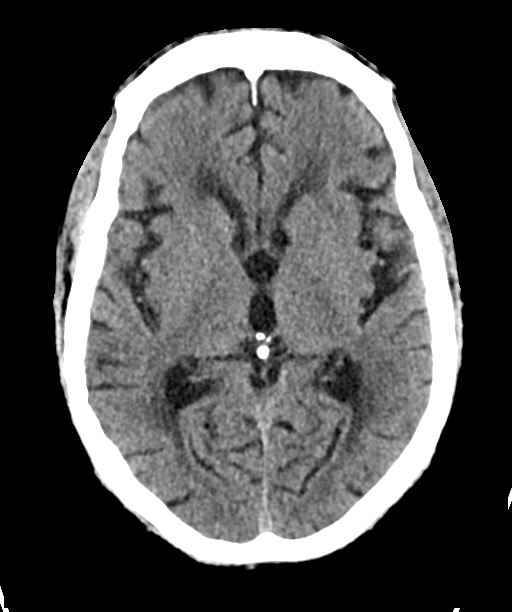
[im 23/35  brain]
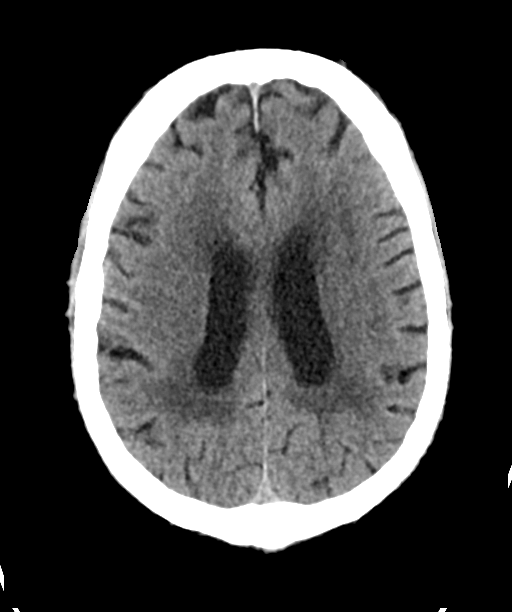
[im 29/35  brain]
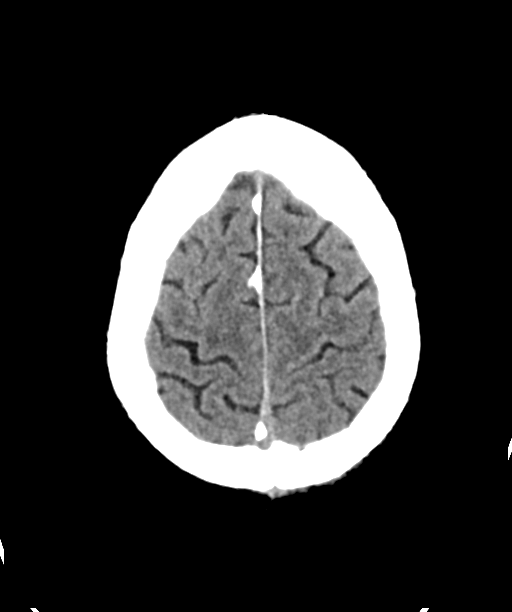
[im 29/35  bone]
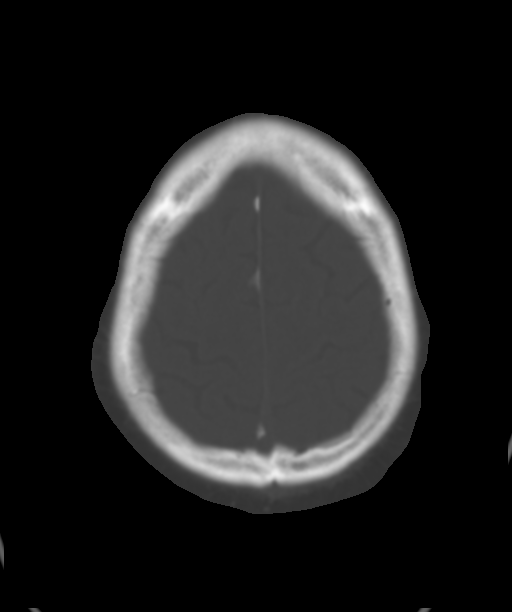

[15 of 47 positions shown; findings below may reference images not displayed]

FINDINGS: Brain: There is no mass, hemorrhage or extra-axial collection. The
size and configuration of the ventricles and extra-axial CSF spaces
are normal. There is hypoattenuation of the periventricular white
matter, most commonly indicating chronic ischemic microangiopathy.

Vascular: Atherosclerotic calcification of the internal carotid
arteries at the skull base. No abnormal hyperdensity of the major
intracranial arteries or dural venous sinuses.

Skull: The visualized skull base, calvarium and extracranial soft
tissues are normal.

Sinuses/Orbits: No fluid levels or advanced mucosal thickening of
the visualized paranasal sinuses. No mastoid or middle ear effusion.
The orbits are normal.

ASPECTS (Alberta Stroke Program Early CT Score)

- Ganglionic level infarction (caudate, lentiform nuclei, internal
capsule, insula, M1-M3 cortex): 7

- Supraganglionic infarction (M4-M6 cortex): 3

Total score (0-10 with 10 being normal): 10
IMPRESSION: 1. Chronic ischemic microangiopathy without acute intracranial
abnormality.
2. ASPECTS is 10.

These results were communicated to Dr. Hiroko Montague at [DATE] on
05/22/2020 by text page via the AMION messaging system.

## 2021-11-19 IMAGING — MR MR HEAD W/O CM
12 of 13 series · 44 of 48 positions shown · non-contrast
Comparison: None.

CLINICAL DATA: Acute neurologic deficit.

EXAM:
MRI HEAD WITHOUT CONTRAST
TECHNIQUE: Multiplanar, multiecho pulse sequences of the brain and surrounding
structures were obtained without intravenous contrast.

[Series 5: DWI · axial · 3.0mm · 0.92mm/px · z∈[-69,+75]mm · 8 of 100 slices shown (1 of 4)]
[im 1/100]
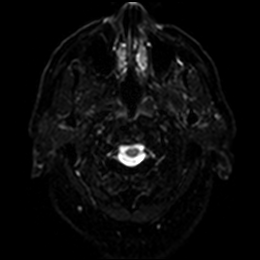
[im 15/100]
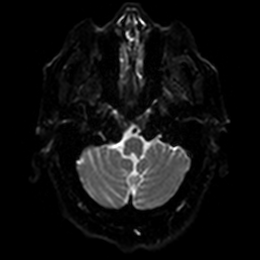
[im 29/100]
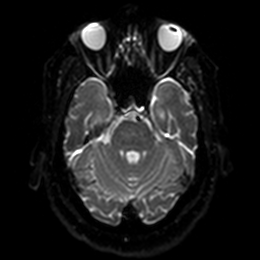
[im 43/100]
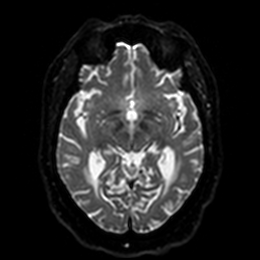
[im 57/100]
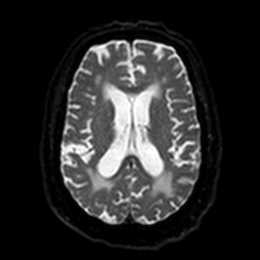
[im 71/100]
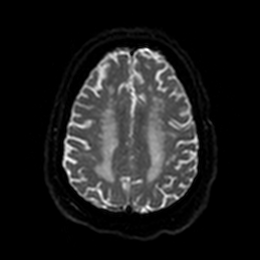
[im 85/100]
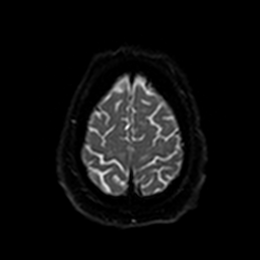
[im 100/100]
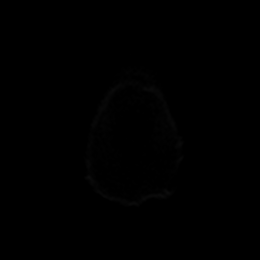

[Series 6: DWI · axial · 3.0mm · 0.92mm/px · z∈[-69,+75]mm · 4 of 50 slices shown (2 of 4)]
[im 1/50]
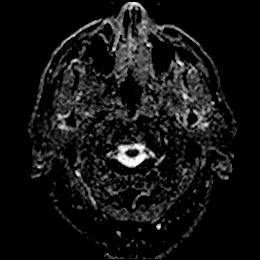
[im 17/50]
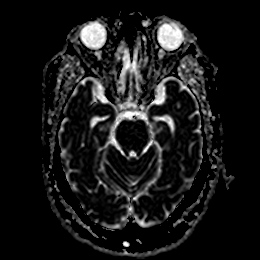
[im 33/50]
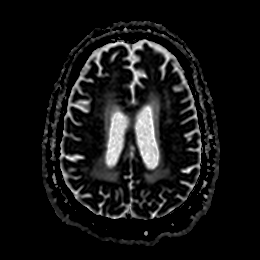
[im 50/50]
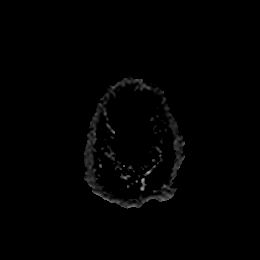

[Series 7: DWI · coronal · 4.0mm · 0.88mm/px · 5 of 74 slices shown (3 of 4)]
[im 1/74]
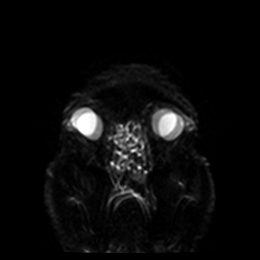
[im 19/74]
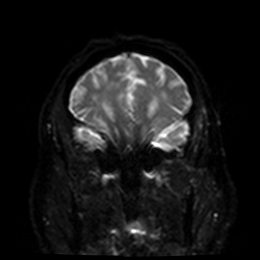
[im 37/74]
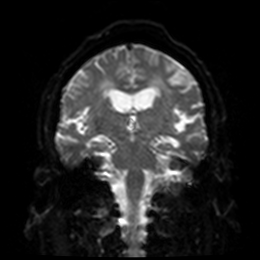
[im 55/74]
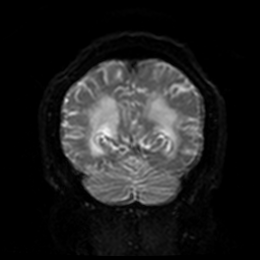
[im 74/74]
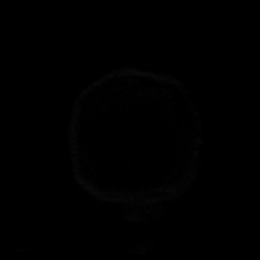

[Series 8: DWI · coronal · 4.0mm · 0.88mm/px · 3 of 37 slices shown (4 of 4)]
[im 1/37]
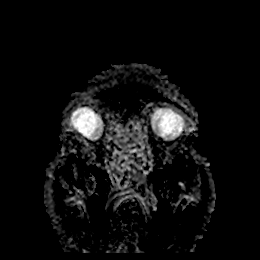
[im 19/37]
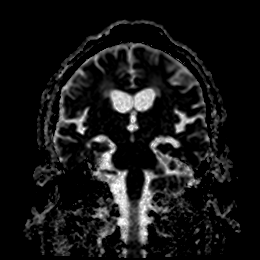
[im 37/37]
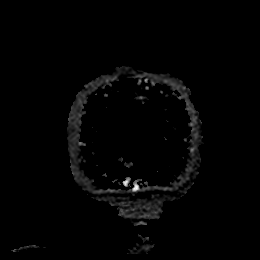

[Series 9: T1 · sagittal · 5.0mm · 0.78mm/px · 2 of 27 slices shown]
[im 1/27]
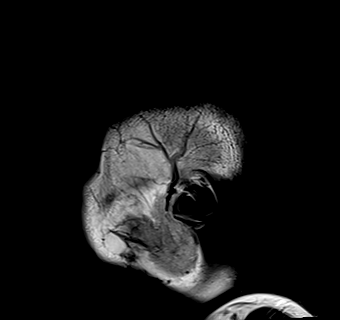
[im 27/27]
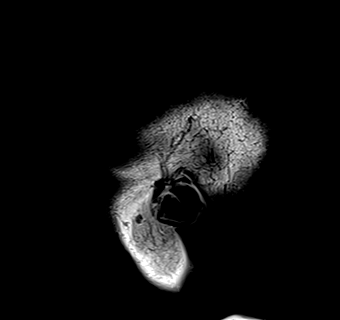

[Series 10: T2 · axial · 5.0mm · 0.75mm/px · z∈[-73,+81]mm · 2 of 27 slices shown (1 of 2)]
[im 1/27]
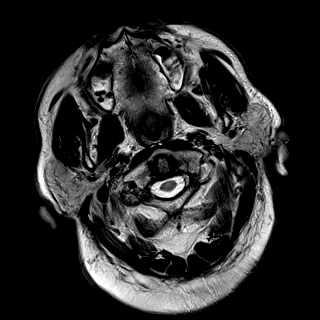
[im 27/27]
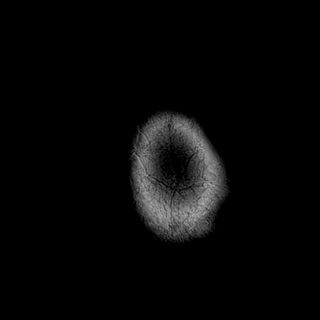

[Series 11: FLAIR · axial · 5.0mm · 0.94mm/px · z∈[-73,+81]mm · 2 of 27 slices shown]
[im 1/27]
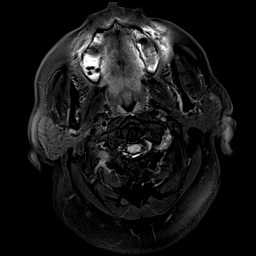
[im 27/27]
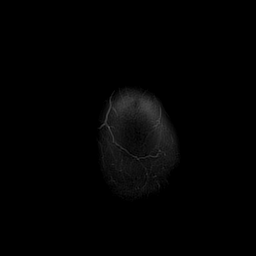

[Series 12: mag_images · axial · 3.0mm · 0.94mm/px · z∈[-82,+91]mm · 4 of 60 slices shown]
[im 1/60]
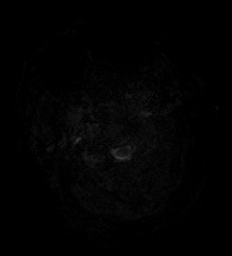
[im 20/60]
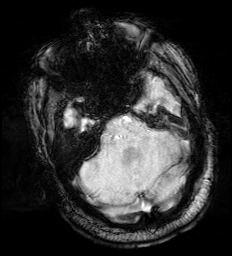
[im 40/60]
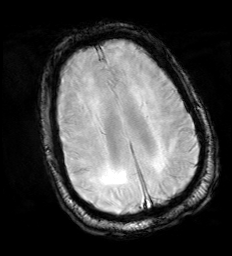
[im 60/60]
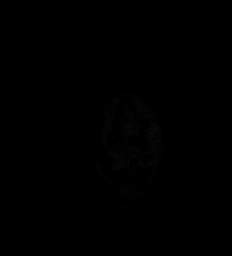

[Series 13: pha_images · axial · 3.0mm · 0.94mm/px · z∈[-82,+91]mm · 4 of 60 slices shown]
[im 1/60]
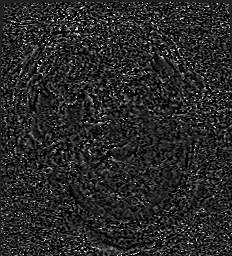
[im 20/60]
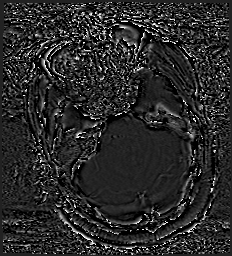
[im 40/60]
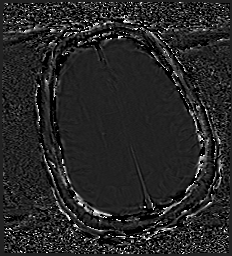
[im 60/60]
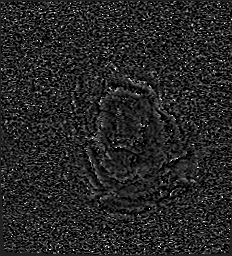

[Series 14: swi_images · axial · 3.0mm · 0.94mm/px · z∈[-82,+91]mm · 4 of 60 slices shown]
[im 1/60]
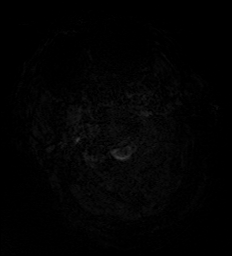
[im 20/60]
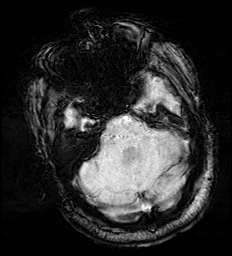
[im 40/60]
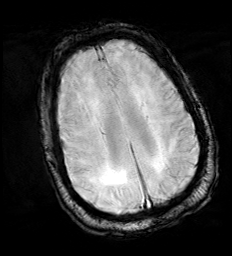
[im 60/60]
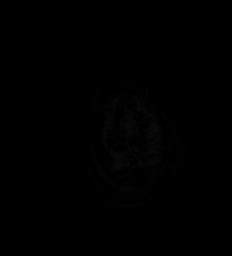

[Series 15: mip_images(sw) · axial · 24.0mm · 0.94mm/px · z∈[-72,+81]mm · 4 of 53 slices shown]
[im 1/53]
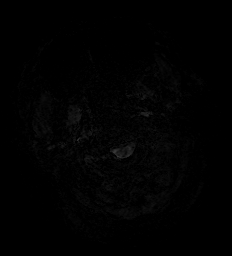
[im 18/53]
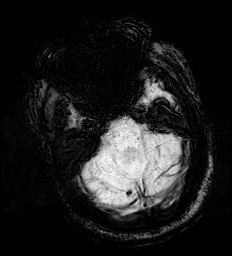
[im 35/53]
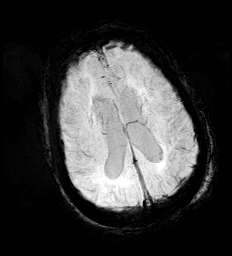
[im 53/53]
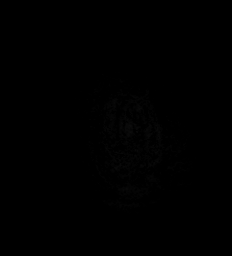

[Series 16: T2 · coronal · 5.0mm · 0.72mm/px · 2 of 32 slices shown (2 of 2)]
[im 1/32]
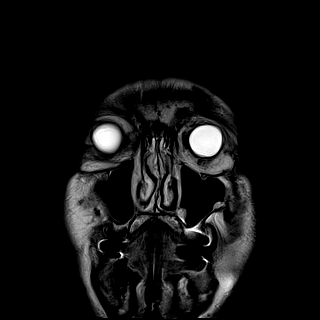
[im 32/32]
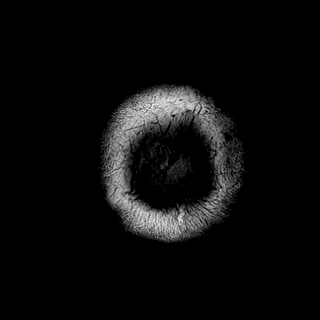

[44 of 48 positions shown; findings below may reference images not displayed]

FINDINGS: Brain: No acute infarct, mass effect or extra-axial collection.
Chronic microhemorrhage in the right occipital lobe. Hyperintense
T2-weighted signal is moderately widespread throughout the white
matter. Generalized volume loss without a clear lobar predilection.
The midline structures are normal.

Vascular: Major flow voids are preserved.

Skull and upper cervical spine: Normal calvarium and skull base.
Visualized upper cervical spine and soft tissues are normal.

Sinuses/Orbits:No paranasal sinus fluid levels or advanced mucosal
thickening. No mastoid or middle ear effusion. Normal orbits.
IMPRESSION: 1. No acute intracranial abnormality.
2. Generalized volume loss and findings of chronic microvascular
ischemia.

## 2022-10-10 ENCOUNTER — Ambulatory Visit: Payer: Medicare PPO | Admitting: Podiatry

## 2022-10-10 ENCOUNTER — Encounter: Payer: Self-pay | Admitting: Podiatry

## 2022-10-10 ENCOUNTER — Ambulatory Visit (INDEPENDENT_AMBULATORY_CARE_PROVIDER_SITE_OTHER): Payer: Medicare PPO | Admitting: Podiatry

## 2022-10-10 DIAGNOSIS — M79675 Pain in left toe(s): Secondary | ICD-10-CM | POA: Diagnosis not present

## 2022-10-10 DIAGNOSIS — M79674 Pain in right toe(s): Secondary | ICD-10-CM

## 2022-10-10 DIAGNOSIS — B351 Tinea unguium: Secondary | ICD-10-CM

## 2022-10-10 DIAGNOSIS — E119 Type 2 diabetes mellitus without complications: Secondary | ICD-10-CM | POA: Diagnosis not present

## 2022-10-10 NOTE — Progress Notes (Signed)
This patient presents to the office with chief complaint of long thick nails and diabetic feet.  This patient  says there  is  no pain and discomfort in their feet.  This patient says there are long thick painful nails.  These nails are painful walking and wearing shoes.  Patient has no history of infection or drainage from both feet.  Patient is unable to  self treat his own nails . This patient presents  to the office today for treatment of the  long nails and a foot evaluation due to history of  diabetes. ? ?General Appearance  Alert, conversant and in no acute stress. ? ?Vascular  Dorsalis pedis and posterior tibial  pulses are palpable  bilaterally.  Capillary return is within normal limits  bilaterally. Temperature is within normal limits  bilaterally. ? ?Neurologic  Senn-Weinstein monofilament wire test within normal limits  bilaterally. Muscle power within normal limits bilaterally. ? ?Nails Thick disfigured discolored nails with subungual debris  hallux nails  bilaterally. No evidence of bacterial infection or drainage bilaterally. ? ?Orthopedic  No limitations of motion of motion feet .  No crepitus or effusions noted.  No bony pathology or digital deformities noted. ? ?Skin  normotropic skin with no porokeratosis noted bilaterally.  No signs of infections or ulcers noted.    ? ?Onychomycosis  Diabetes with no foot complications ? ?IE  Debride nails x 10.  A diabetic foot exam was performed and there is no evidence of any vascular or neurologic pathology.   RTC 3 months. ? ? ?Helane Gunther DPM   ?

## 2023-01-16 ENCOUNTER — Ambulatory Visit (INDEPENDENT_AMBULATORY_CARE_PROVIDER_SITE_OTHER): Payer: Medicare PPO | Admitting: Podiatry

## 2023-01-16 ENCOUNTER — Encounter: Payer: Self-pay | Admitting: Podiatry

## 2023-01-16 DIAGNOSIS — E119 Type 2 diabetes mellitus without complications: Secondary | ICD-10-CM | POA: Diagnosis not present

## 2023-01-16 DIAGNOSIS — B351 Tinea unguium: Secondary | ICD-10-CM

## 2023-01-16 DIAGNOSIS — M79675 Pain in left toe(s): Secondary | ICD-10-CM | POA: Diagnosis not present

## 2023-01-16 DIAGNOSIS — M79674 Pain in right toe(s): Secondary | ICD-10-CM

## 2023-01-16 NOTE — Progress Notes (Signed)
This patient returns to my office for at risk foot care.  This patient requires this care by a professional since this patient will be at risk due to having  diabetes.  This patient is unable to cut nails himself since the patient cannot reach his nails.These nails are painful walking and wearing shoes.  This patient presents for at risk foot care today.  General Appearance  Alert, conversant and in no acute stress.  Vascular  Dorsalis pedis and posterior tibial  pulses are palpable  bilaterally.  Capillary return is within normal limits  bilaterally. Temperature is within normal limits  bilaterally.  Neurologic  Senn-Weinstein monofilament wire test within normal limits  bilaterally. Muscle power within normal limits bilaterally.  Nails Thick disfigured discolored nails with subungual debris  hallux nails  bilaterally. No evidence of bacterial infection or drainage bilaterally.  Orthopedic  No limitations of motion  feet .  No crepitus or effusions noted.  No bony pathology or digital deformities noted.  Skin  normotropic skin with no porokeratosis noted bilaterally.  No signs of infections or ulcers noted.     Onychomycosis  Pain in right toes  Pain in left toes  Consent was obtained for treatment procedures.   Mechanical debridement of nails 1-5  bilaterally performed with a nail nipper.  Filed with dremel without incident.    Return office visit     3 months                Told patient to return for periodic foot care and evaluation due to potential at risk complications.   Gardiner Barefoot DPM

## 2023-04-22 ENCOUNTER — Ambulatory Visit (INDEPENDENT_AMBULATORY_CARE_PROVIDER_SITE_OTHER): Payer: Medicare PPO | Admitting: Podiatry

## 2023-05-21 ENCOUNTER — Encounter: Payer: Self-pay | Admitting: Neurology

## 2023-05-21 ENCOUNTER — Ambulatory Visit (INDEPENDENT_AMBULATORY_CARE_PROVIDER_SITE_OTHER): Payer: Medicare PPO | Admitting: Neurology

## 2023-05-21 VITALS — BP 124/67 | HR 63 | Ht 72.0 in | Wt 234.8 lb

## 2023-05-21 DIAGNOSIS — Z7189 Other specified counseling: Secondary | ICD-10-CM | POA: Insufficient documentation

## 2023-05-21 DIAGNOSIS — F339 Major depressive disorder, recurrent, unspecified: Secondary | ICD-10-CM | POA: Insufficient documentation

## 2023-05-21 DIAGNOSIS — R4189 Other symptoms and signs involving cognitive functions and awareness: Secondary | ICD-10-CM | POA: Diagnosis not present

## 2023-05-21 DIAGNOSIS — Z029 Encounter for administrative examinations, unspecified: Secondary | ICD-10-CM | POA: Insufficient documentation

## 2023-05-21 DIAGNOSIS — Z23 Encounter for immunization: Secondary | ICD-10-CM | POA: Insufficient documentation

## 2023-05-21 DIAGNOSIS — E119 Type 2 diabetes mellitus without complications: Secondary | ICD-10-CM | POA: Insufficient documentation

## 2023-05-21 DIAGNOSIS — J309 Allergic rhinitis, unspecified: Secondary | ICD-10-CM | POA: Insufficient documentation

## 2023-05-21 DIAGNOSIS — H04123 Dry eye syndrome of bilateral lacrimal glands: Secondary | ICD-10-CM | POA: Insufficient documentation

## 2023-05-21 DIAGNOSIS — G250 Essential tremor: Secondary | ICD-10-CM | POA: Insufficient documentation

## 2023-05-21 DIAGNOSIS — G3184 Mild cognitive impairment, so stated: Secondary | ICD-10-CM | POA: Insufficient documentation

## 2023-05-21 DIAGNOSIS — G8929 Other chronic pain: Secondary | ICD-10-CM | POA: Insufficient documentation

## 2023-05-21 DIAGNOSIS — Z7729 Contact with and (suspected ) exposure to other hazardous substances: Secondary | ICD-10-CM | POA: Insufficient documentation

## 2023-05-21 DIAGNOSIS — R0981 Nasal congestion: Secondary | ICD-10-CM | POA: Insufficient documentation

## 2023-05-21 NOTE — Progress Notes (Addendum)
 Chief Complaint  Patient presents with   Room 15    Pt is here Alone. Pt states that his tremors has gotten worse to the point that when he tries to drink something, he is spilling his drinks on himself as well as when he is eating. Pt states that he hasn't been able to write his name due to his tremors.       ASSESSMENT AND PLAN  Dominic Goodwin is a 83 y.o. male   Cognitive impairment Essential tremor  Laboratory evaluation to rule treatable etiology for memory loss,  Already on primidone, metoprolol  No parkinsonian features on examination, advised patient to weighted utensil, resting elbow on surface  DIAGNOSTIC DATA (LABS, IMAGING, TESTING) - I reviewed patient records, labs, notes, testing and imaging myself where available.   MEDICAL HISTORY:  Dominic Goodwin, is a 83 year old male seen in request by his primary care from [facility] for evaluation of memory loss, bilateral hands tremor  History is obtained from the patient and review of electronic medical records. I personally reviewed pertinent available imaging films in PACS.   PMHx of  DM HTN CAD HLD  He lives alone, drove to clinic today, reported gradual onset of memory loss over the past 3 years since his wife passed away, he retired from [employer] at age 54, still able to drive himself without getting lost, also attend [employer] regularly,  He had a few years history of bilateral hands tremor, also getting worse over the past couple years, difficulty holding utensils, water spilled from cup, he has to use both hands with holding his cup,  He could not recall his medication list, is getting help from [place] [facility] every 2 weeks to refill his home medication," I just popping my mouth", reviewing the list he is on primidone 50 mg twice a day, also on metoprolol  MRI of the ring from March 2022, generalized as atrophy, moderate small vessel disease  CT angiogram of head and neck, no large vessel  disease   PHYSICAL EXAM:   Vitals:   05/21/23 0824  BP: 124/67  Pulse: 63  Weight: 234 lb 12.8 oz (106.5 kg)  Height: 6' (1.829 m)      Body mass index is 31.84 kg/m.  PHYSICAL EXAMNIATION:  Gen: NAD, conversant, well nourised, well groomed                     Cardiovascular: Regular rate rhythm, no peripheral edema, warm, nontender. Eyes: Conjunctivae clear without exudates or hemorrhage Neck: Supple, no carotid bruits. Pulmonary: Clear to auscultation bilaterally   NEUROLOGICAL EXAM:  MENTAL STATUS: Speech/cognition: Awake, alert, oriented to history taking and casual conversation    05/21/2023    9:00 AM  MMSE - Mini Mental State Exam  Orientation to time 5  Orientation to Place 5  Registration 3  Attention/ Calculation 2  Recall 0  Language- name 2 objects 2  Language- repeat 1  Language- follow 3 step command 3  Language- read & follow direction 1  Write a sentence 1  Copy design 1  Total score 24    CRANIAL NERVES: CN II: Visual fields are full to confrontation. Pupils are round equal and briskly reactive to light. CN III, IV, VI: extraocular movement are normal. No ptosis. CN V: Facial sensation is intact to light touch CN VII: Face is symmetric with normal eye closure  CN VIII: Hearing is normal to causal conversation. CN IX, X: Phonation is normal. CN  XI: Head turning and shoulder shrug are intact  MOTOR: Mild abnormal posturing tremor, no weakness, no rigidity, no bradykinesia  REFLEXES: Reflexes are 2+ and symmetric at the biceps, triceps, knees, and absent at ankles. Plantar responses are flexor.  SENSORY: Mild length-dependent sensory changes  COORDINATION: There is no trunk or limb dysmetria noted.  GAIT/STANCE: Able to get up from seated position arm crossed, gait is steady   REVIEW OF SYSTEMS:  Full 14 system review of systems performed and notable only for as above All other review of systems were negative.   ALLERGIES: No  Known Allergies  HOME MEDICATIONS: Current Outpatient Medications  Medication Sig Dispense Refill   acetaminophen (TYLENOL) 325 MG tablet Take 325 mg by mouth 3 (three) times daily as needed for mild pain.     aspirin EC 81 MG EC tablet Take 1 tablet (81 mg total) by mouth daily. Swallow whole. 30 tablet 0   atorvastatin (LIPITOR) 80 MG tablet Take 1 tablet by mouth daily.     Blood Glucose Monitoring Suppl KIT See admin instructions.     carboxymethylcellul-glycerin (REFRESH OPTIVE) 0.5-0.9 % ophthalmic solution Place 1 drop into both eyes 3 (three) times daily as needed for dry eyes.     cetirizine (ZYRTEC) 10 MG tablet Take by mouth.     cholecalciferol (VITAMIN D3) 25 MCG (1000 UNIT) tablet Take 1 tablet by mouth daily.     cyanocobalamin (VITAMIN B12) 500 MCG tablet Take by mouth.     cycloSPORINE (RESTASIS) 0.05 % ophthalmic emulsion Apply to eye.     Empagliflozin-metFORMIN HCl ER 12.07-998 MG TB24 Take by mouth.     glucose blood (PRECISION QID TEST) test strip by Does not apply route.     Lancets MISC Check glucose daily as directed     losartan (COZAAR) 100 MG tablet Take 1 tablet by mouth daily.     metFORMIN (GLUCOPHAGE) 1000 MG tablet Take 1,000 mg by mouth 2 (two) times daily with a meal.     amLODipine (NORVASC) 10 MG tablet Take by mouth.     atorvastatin (LIPITOR) 80 MG tablet Take 80 mg by mouth daily. (Patient not taking: Reported on 05/21/2023)     Calcium Polycarbophil (FIBER) 625 MG TABS Take 1 tablet by mouth daily. (Patient not taking: Reported on 05/21/2023)     Carboxymethylcellulose Sod PF 0.5 % SOLN Apply to eye. (Patient not taking: Reported on 05/21/2023)     donepezil (ARICEPT) 10 MG tablet Take 10 mg by mouth at bedtime. (Patient not taking: Reported on 05/21/2023)     donepezil (ARICEPT) 10 MG tablet Take 1 tablet by mouth daily. (Patient not taking: Reported on 05/21/2023)     escitalopram (LEXAPRO) 10 MG tablet Take by mouth.     finasteride (PROSCAR) 5 MG  tablet Take 5 mg by mouth daily.     finasteride (PROSCAR) 5 MG tablet Take 1 tablet by mouth daily.     fluticasone (FLONASE) 50 MCG/ACT nasal spray Place into the nose.     furosemide (LASIX) 20 MG tablet Take by mouth.     glipiZIDE (GLUCOTROL) 10 MG tablet Take 10 mg by mouth daily before breakfast.     glipiZIDE (GLUCOTROL) 5 MG tablet Take 1 tablet by mouth daily.     isosorbide mononitrate (IMDUR) 30 MG 24 hr tablet Take 30 mg by mouth daily.     isosorbide mononitrate (IMDUR) 30 MG 24 hr tablet Take 1 tablet by mouth daily.  isosorbide mononitrate (IMDUR) 60 MG 24 hr tablet Take by mouth.     loratadine (CLARITIN) 10 MG tablet Take by mouth.     losartan (COZAAR) 100 MG tablet Take 100 mg by mouth daily. (Patient not taking: Reported on 05/21/2023)     metFORMIN (GLUCOPHAGE) 1000 MG tablet Take by mouth. (Patient not taking: Reported on 05/21/2023)     metFORMIN (GLUCOPHAGE) 500 MG tablet Take by mouth. (Patient not taking: Reported on 05/21/2023)     metoprolol tartrate (LOPRESSOR) 50 MG tablet Take 50 mg by mouth 2 (two) times daily. (Patient not taking: Reported on 05/21/2023)     mirabegron ER (MYRBETRIQ) 50 MG TB24 tablet Take 50 mg by mouth daily. (Patient not taking: Reported on 05/21/2023)     nitroGLYCERIN (NITROSTAT) 0.4 MG SL tablet Place under the tongue. (Patient not taking: Reported on 05/21/2023)     potassium chloride (KLOR-CON M) 10 MEQ tablet Take by mouth.     potassium chloride (MICRO-K) 10 MEQ CR capsule Take by mouth.     primidone (MYSOLINE) 50 MG tablet Take 1 tablet by mouth 2 (two) times daily.     primidone (MYSOLINE) 50 MG tablet Take 1 tablet by mouth 2 (two) times daily.     sodium chloride (OCEAN) 0.65 % nasal spray Place into the nose.     sodium chloride (OCEAN) 0.65 % SOLN nasal spray Place 2 sprays into both nostrils daily.     tamsulosin (FLOMAX) 0.4 MG CAPS capsule Take 0.4 mg by mouth daily.     tamsulosin (FLOMAX) 0.4 MG CAPS capsule Take by mouth.      telmisartan (MICARDIS) 80 MG tablet Take by mouth.     Vitamin D3 (VITAMIN D) 25 MCG tablet Take 1,000 Units by mouth daily.     No current facility-administered medications for this visit.    PAST MEDICAL HISTORY: Past Medical History:  Diagnosis Date   Coronary artery disease    Dyspnea on exertion    Elevated PSA    History of stress test    Did not show evidence of ischemia, however, it showed somewhat diminished exercise tolerance and hypertensive response to exercise   Hx of echocardiogram 09/22/2008   EF >55% showed normal left ventricular systolic functon and grade 1 diastolic dysfunction.   Hyperlipidemia    Hypertension    MI (myocardial infarction) (HCC) 2007   which was treated with medical therapy.    Sleep apnea    Type 2 diabetes mellitus (HCC)     PAST SURGICAL HISTORY: History reviewed. No pertinent surgical history.  FAMILY HISTORY: History reviewed. No pertinent family history.  SOCIAL HISTORY: Social History   Socioeconomic History   Marital status: Married    Spouse name: Not on file   Number of children: Not on file   Years of education: Not on file   Highest education level: Not on file  Occupational History   Not on file  Tobacco Use   Smoking status: Former    Current packs/day: 0.00    Types: Cigarettes    Quit date: 11/18/2000    Years since quitting: 22.5   Smokeless tobacco: Never  Substance and Sexual Activity   Alcohol use: No   Drug use: No   Sexual activity: Not on file  Other Topics Concern   Not on file  Social History Narrative   Not on file   Social Drivers of Health   Financial Resource Strain: Low Risk  (11/29/2020)   Received  from [facility]   Overall Financial Resource Strain (CARDIA)    Difficulty of Paying Living Expenses: Not hard at all  Food Insecurity: No Food Insecurity (05/30/2021)   Received from [facility]   Hunger Vital Sign    Worried About Running Out of Food in the Last Year: Never true     Ran Out of Food in the Last Year: Never true  Transportation Needs: No Transportation Needs (11/29/2020)   Received from [facility] - Transportation    Lack of Transportation (Medical): No    Lack of Transportation (Non-Medical): No  Physical Activity: Insufficiently Active (11/29/2020)   Received from [facility]   Exercise Vital Sign    Days of Exercise per Week: 2 days    Minutes of Exercise per Session: 10 min  Stress: No Stress Concern Present (11/29/2020)   Received from [facility] - Occupational Stress Questionnaire    Feeling of Stress : Not at all  Social Connections: Unknown (07/10/2021)   Received from [facility]   Social Network    Social Network: Not on file  Intimate Partner Violence: Unknown (06/13/2021)   Received from [facility]   HITS    Physically Hurt: Not on file    Insult or Talk Down To: Not on file    Threaten Physical Harm: Not on file    Scream or Curse: Not on file      Levert Feinstein, M.D. Ph.D.  [facility] [address] [place], [place] [address] Ph: [phone] Fax: [phone]  CC:  Dennard Schaumann, MD [address] [place],  [place] [address]  Patient, No Pcp Per
